# Patient Record
Sex: Female | Born: 1980 | Race: Black or African American | Hispanic: No | Marital: Single | Smoking: Never smoker
Health system: Southern US, Community
[De-identification: ages and names within clinical notes are randomized; demographics above are authoritative.]

## PROBLEM LIST (undated history)

## (undated) DIAGNOSIS — K219 Gastro-esophageal reflux disease without esophagitis: Secondary | ICD-10-CM

## (undated) DIAGNOSIS — Z8619 Personal history of other infectious and parasitic diseases: Secondary | ICD-10-CM

## (undated) DIAGNOSIS — I10 Essential (primary) hypertension: Secondary | ICD-10-CM

## (undated) DIAGNOSIS — F419 Anxiety disorder, unspecified: Secondary | ICD-10-CM

## (undated) HISTORY — PX: TONSILLECTOMY: SUR1361

## (undated) HISTORY — PX: NO PAST SURGERIES: SHX2092

## (undated) HISTORY — DX: Personal history of other infectious and parasitic diseases: Z86.19

## (undated) HISTORY — DX: Essential (primary) hypertension: I10

---
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
## 2002-04-07 ENCOUNTER — Emergency Department (HOSPITAL_COMMUNITY): Admission: EM | Admit: 2002-04-07 | Discharge: 2002-04-07 | Payer: Self-pay | Admitting: Emergency Medicine

## 2002-04-07 ENCOUNTER — Encounter: Payer: Self-pay | Admitting: Emergency Medicine

## 2005-04-18 ENCOUNTER — Emergency Department (HOSPITAL_COMMUNITY): Admission: EM | Admit: 2005-04-18 | Discharge: 2005-04-18 | Payer: Self-pay | Admitting: Emergency Medicine

## 2009-05-07 ENCOUNTER — Emergency Department (HOSPITAL_COMMUNITY): Admission: EM | Admit: 2009-05-07 | Discharge: 2009-05-07 | Payer: Self-pay | Admitting: Emergency Medicine

## 2009-05-10 ENCOUNTER — Emergency Department (HOSPITAL_COMMUNITY): Admission: EM | Admit: 2009-05-10 | Discharge: 2009-05-10 | Payer: Self-pay | Admitting: Emergency Medicine

## 2010-04-11 ENCOUNTER — Ambulatory Visit
Admission: RE | Admit: 2010-04-11 | Discharge: 2010-04-11 | Payer: Self-pay | Source: Home / Self Care | Attending: Family Medicine | Admitting: Family Medicine

## 2010-04-11 ENCOUNTER — Other Ambulatory Visit: Payer: Self-pay | Admitting: Family Medicine

## 2010-04-11 ENCOUNTER — Encounter: Payer: Self-pay | Admitting: Family Medicine

## 2010-04-11 DIAGNOSIS — F3289 Other specified depressive episodes: Secondary | ICD-10-CM | POA: Insufficient documentation

## 2010-04-11 DIAGNOSIS — K219 Gastro-esophageal reflux disease without esophagitis: Secondary | ICD-10-CM | POA: Insufficient documentation

## 2010-04-11 DIAGNOSIS — F329 Major depressive disorder, single episode, unspecified: Secondary | ICD-10-CM | POA: Insufficient documentation

## 2010-04-11 LAB — CBC WITH DIFFERENTIAL/PLATELET
Basophils Absolute: 0 10*3/uL (ref 0.0–0.1)
Basophils Relative: 0.4 % (ref 0.0–3.0)
Eosinophils Absolute: 0 10*3/uL (ref 0.0–0.7)
Eosinophils Relative: 0.3 % (ref 0.0–5.0)
HCT: 41 % (ref 36.0–46.0)
Hemoglobin: 13.8 g/dL (ref 12.0–15.0)
Lymphocytes Relative: 40.2 % (ref 12.0–46.0)
Lymphs Abs: 2.6 10*3/uL (ref 0.7–4.0)
MCHC: 33.6 g/dL (ref 30.0–36.0)
MCV: 91.2 fl (ref 78.0–100.0)
Monocytes Absolute: 0.5 10*3/uL (ref 0.1–1.0)
Monocytes Relative: 7.1 % (ref 3.0–12.0)
Neutro Abs: 3.3 10*3/uL (ref 1.4–7.7)
Neutrophils Relative %: 52 % (ref 43.0–77.0)
Platelets: 320 10*3/uL (ref 150.0–400.0)
RBC: 4.5 Mil/uL (ref 3.87–5.11)
RDW: 14.7 % — ABNORMAL HIGH (ref 11.5–14.6)
WBC: 6.4 10*3/uL (ref 4.5–10.5)

## 2010-04-11 LAB — BASIC METABOLIC PANEL
BUN: 10 mg/dL (ref 6–23)
CO2: 26 mEq/L (ref 19–32)
Calcium: 9.2 mg/dL (ref 8.4–10.5)
Chloride: 107 mEq/L (ref 96–112)
Creatinine, Ser: 0.9 mg/dL (ref 0.4–1.2)
GFR: 100.49 mL/min (ref >60.00)
Glucose, Bld: 82 mg/dL (ref 70–99)
Potassium: 4.2 mEq/L (ref 3.5–5.1)
Sodium: 142 mEq/L (ref 135–145)

## 2010-04-11 LAB — LDL CHOLESTEROL, DIRECT: Direct LDL: 101.2 mg/dL

## 2010-04-11 LAB — LIPID PANEL
Cholesterol: 217 mg/dL — ABNORMAL HIGH (ref 0–200)
HDL: 98.3 mg/dL (ref >39.00)
Total CHOL/HDL Ratio: 2
Triglycerides: 49 mg/dL (ref 0.0–149.0)
VLDL: 9.8 mg/dL (ref 0.0–40.0)

## 2010-04-11 LAB — HEPATIC FUNCTION PANEL
ALT: 10 U/L (ref 0–35)
AST: 16 U/L (ref 0–37)
Albumin: 3.9 g/dL (ref 3.5–5.2)
Alkaline Phosphatase: 64 U/L (ref 39–117)
Bilirubin, Direct: 0.1 mg/dL (ref 0.0–0.3)
Total Bilirubin: 0.6 mg/dL (ref 0.3–1.2)
Total Protein: 7.2 g/dL (ref 6.0–8.3)

## 2010-04-11 LAB — TSH: TSH: 0.54 u[IU]/mL (ref 0.35–5.50)

## 2010-04-11 LAB — H. PYLORI ANTIBODY, IGG: H Pylori IgG: NEGATIVE

## 2010-04-15 ENCOUNTER — Telehealth (INDEPENDENT_AMBULATORY_CARE_PROVIDER_SITE_OTHER): Payer: Self-pay | Admitting: *Deleted

## 2010-05-01 NOTE — Assessment & Plan Note (Signed)
Summary: cpx and fasting labs///sph   Vital Signs:  Patient profile:   30 year old female Height:      53.75 inches Weight:      186 pounds BMI:     45.43 Pulse rate:   80 / minute BP sitting:   110 / 60  (left arm)  Vitals Entered By: Doristine Devoid CMA (April 11, 2010 8:16 AM) CC: NEW EST- acid reflux and increase stress    History of Present Illness: 30 yo woman here today to establish care.  previous MD- none.  GYN- [facility].  1) GERD- has used Prilosec w/out relief and Nexium w/ some relief.  will still have burning sensation in stomach.  has increased abdominal cramping.  if she eats prior to lying down will wake up w/ food in throat.  any acidic food increases sxs.  not currently on meds.  grandmother has hiatal hernia.  2) increased stress- mom has breast cancer, 56 month old nephew just died, recently ended relationship, work stress.  crying frequently.  no longer enjoying things she used to enjoy.  withdrawing from people.  having difficulty both falling and staying asleep- using sleep aids daily.  eating less.  increased irritability and mood swings.  Preventive Screening-Counseling & Management  Alcohol-Tobacco     Alcohol drinks/day: <1     Smoking Status: never  Caffeine-Diet-Exercise     Does Patient Exercise: no      Drug Use:  never.    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 No apparent alopecia or balding. Neck:  No deformities, masses, or tenderness noted. Lungs:  Normal respiratory effort, chest expands symmetrically. Lungs are clear to auscultation, no crackles or wheezes. Heart:  Normal rate and regular rhythm. S1 and S2 normal without gallop, murmur, click, rub or other extra sounds. Abdomen:  Bowel sounds positive,abdomen soft and non-tender without masses, organomegaly or hernias noted. Psych:  holding back tears, good eye contact, not anxious appearing, but depressed   Impression & Recommendations:  Problem # 1:  GERD (ICD-530.81) Assessment New check H pylori to r/o PUD.  samples of nexium given.  reviewed lifestyle modifications.  will follow. Orders: Venipuncture (11914) Specimen Handling (78295) TLB-H. Pylori Abs(Helicobacter Pylori) (86677-HELICO)  Problem # 2:  DEPRESSIVE DISORDER (ICD-311) Assessment: New pt has a lot of stressors currently.  discussed option of starting meds, pt not interested at this time.  is interested in counseling.  #s provided.  Complete Medication List: 1)  Vitamin D (ergocalciferol) 50000 Unit Caps (Ergocalciferol) .... Take one tablet weekly x12 weeks  Other Orders: T-Vitamin D (25-Hydroxy) ([id number]) TLB-Lipid Panel (80061-LIPID) TLB-BMP (Basic Metabolic Panel-BMET) (80048-METABOL) TLB-CBC Platelet - w/Differential (85025-CBCD) TLB-Hepatic/Liver Function Pnl (80076-HEPATIC) TLB-TSH (Thyroid Stimulating Hormone) (46962-XBM)  Patient Instructions: 1)  Schedule your complete physical in 1 month- you can eat 2)  Start the Nexium daily 3)  Call and set up counseling to deal with some of these emotions 4)  We'll notify you of your lab results 5)  Welcome!  We're glad to have you! 6)  Hang in there!   Orders Added: 1)  Venipuncture [36415] 2)  T-Vitamin D (25-Hydroxy) [phone] 3)  Specimen Handling [99000] 4)  Specimen Handling [99000] 5)  TLB-H. Pylori  Abs(Helicobacter Pylori) [86677-HELICO] 6)  TLB-Lipid Panel [80061-LIPID] 7)  TLB-BMP (Basic Metabolic Panel-BMET) [80048-METABOL] 8)  TLB-CBC Platelet - w/Differential [85025-CBCD] 9)  TLB-Hepatic/Liver Function Pnl [80076-HEPATIC] 10)  TLB-TSH (Thyroid Stimulating Hormone) [84443-TSH] 11)  New Patient Level II [99202]     Preventive Care Screening  Pap Smear:    Date:  05/28/2009    Results:  normal

## 2010-05-01 NOTE — Progress Notes (Signed)
Summary: labs  Phone Note Outgoing Call   Call placed by: Doristine Devoid CMA,  April 15, 2010 10:39 AM Call placed to: Patient Summary of Call: very low.  start 50,000 units Vit D weekly x12 weeks and recheck level.  Follow-up for Phone Call        spoke w/ patient aware of labs and that medication is to be sent to pharmacy...Marland KitchenMarland KitchenDoristine Devoid CMA  April 15, 2010 10:40 AM     New/Updated Medications: VITAMIN D (ERGOCALCIFEROL) 50000 UNIT CAPS (ERGOCALCIFEROL) take one tablet weekly x12 weeks Prescriptions: VITAMIN D (ERGOCALCIFEROL) 50000 UNIT CAPS (ERGOCALCIFEROL) take one tablet weekly x12 weeks  #12 x 0   Entered by:   Doristine Devoid CMA   Authorized by:   Neena Rhymes MD   Signed by:   Doristine Devoid CMA on 04/15/2010   Method used:   Electronically to        [facility]. [phone]* (retail)       [address]       [place], [place]  [address]       Ph: [phone]       Fax: [phone]   RxID:   [id number]

## 2010-05-06 ENCOUNTER — Encounter: Payer: Self-pay | Admitting: Family Medicine

## 2010-05-16 ENCOUNTER — Encounter: Payer: Self-pay | Admitting: Family Medicine

## 2010-05-26 ENCOUNTER — Encounter: Payer: Self-pay | Admitting: Family Medicine

## 2010-05-26 ENCOUNTER — Encounter (INDEPENDENT_AMBULATORY_CARE_PROVIDER_SITE_OTHER): Payer: Managed Care, Other (non HMO) | Admitting: Family Medicine

## 2010-05-26 DIAGNOSIS — Z0000 Encounter for general adult medical examination without abnormal findings: Secondary | ICD-10-CM

## 2010-06-05 NOTE — Assessment & Plan Note (Signed)
Summary: CPX/NO LABS///SPH   Vital Signs:  Patient profile:   30 year old female Height:      53.75 inches Weight:      189 pounds BMI:     46.16 Pulse rate:   76 / minute BP sitting:   120 / 80  (left arm)  Vitals Entered By: Doristine Devoid CMA (May 26, 2010 10:06 AM) CC: CPX AND LABS    History of Present Illness: 30 yo woman here today for CPE.  recents labs reviewed.  UTD on pap- Dr Achilles Dunk.  Depression- has started Journal as emotional outlet.  mom is doing well.  overall feeling 'better'.  'slowly but surely' integrating herself back into normal activities.  Preventive Screening-Counseling & Management  Alcohol-Tobacco     Alcohol drinks/day: <1     Smoking Status: never  Caffeine-Diet-Exercise     Does Patient Exercise: no      Drug Use:  never.    Current Medications (verified): 1)  Vitamin D (Ergocalciferol) 50000 Unit Caps (Ergocalciferol) .... Take One Tablet Weekly X12 Weeks  Allergies (verified): No Known Drug Allergies  Past History:  Past medical, surgical, family and social histories (including risk factors) reviewed, and no changes noted (except as noted below).  Past Medical History: Reviewed history from 04/11/2010 and no changes required. hx of Chicken Pox  Past Surgical History: none  Family History: Reviewed history from 04/11/2010 and no changes required. CAD-no HTN-no DM-grandparents STROKE-grandparents COLON CA-no BREAST CA-mother dx: 63  Social History: Reviewed history from 04/11/2010 and no changes required. 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  The patient denies fever, weight loss, weight gain, vision loss, decreased hearing, hoarseness, chest pain, syncope, dyspnea on exertion, peripheral edema, prolonged cough, headaches, abdominal pain, melena, hematochezia, severe indigestion/heartburn,  hematuria, suspicious skin lesions, abnormal bleeding, enlarged lymph nodes, and breast masses.    Physical Exam  General:  Well-developed,well-nourished,in no acute distress; alert,appropriate and cooperative throughout examination Head:  Normocephalic and atraumatic without obvious abnormalities. No apparent alopecia or balding. Eyes:  No corneal or conjunctival inflammation noted. EOMI. Perrla. Funduscopic exam benign, without hemorrhages, exudates or papilledema. Vision grossly normal. Ears:  External ear exam shows no significant lesions or deformities.  Otoscopic examination reveals clear canals, tympanic membranes are intact bilaterally without bulging, retraction, inflammation or discharge. Hearing is grossly normal bilaterally. Nose:  bilateral turbinate edema Mouth:  Oral mucosa and oropharynx without lesions or exudates.  Teeth in good repair. Neck:  No deformities, masses, or tenderness noted. Breasts:  gyn Lungs:  Normal respiratory effort, chest expands symmetrically. Lungs are clear to auscultation, no crackles or wheezes. Heart:  Normal rate and regular rhythm. S1 and S2 normal without gallop, murmur, click, rub or other extra sounds. Abdomen:  Bowel sounds positive,abdomen soft and non-tender without masses, organomegaly or hernias noted. Genitalia:  gyn Pulses:  + 2 carotid, radial, DP Extremities:  No clubbing, cyanosis, edema, or deformity noted with normal full range of motion of all joints.   Neurologic:  No cranial nerve deficits noted. Station and gait are normal. Plantar reflexes are down-going bilaterally. DTRs are symmetrical throughout. Sensory, motor and coordinative functions appear intact. Skin:  Intact without suspicious lesions or rashes, multiple tattoos Cervical Nodes:  No lymphadenopathy noted Axillary Nodes:  No palpable lymphadenopathy Psych:  Cognition and judgment appear intact. Alert and cooperative with  normal attention span and concentration. No  apparent delusions, illusions, hallucinations   Impression & Recommendations:  Problem # 1:  PHYSICAL EXAMINATION (ICD-V70.0) Assessment Unchanged pt's PE WNL.  reviewed labs from previous visit.  anticipatory guidance provided.  Complete Medication List: 1)  Vitamin D (ergocalciferol) 50000 Unit Caps (Ergocalciferol) .... Take one tablet weekly x12 weeks  Patient Instructions: 1)  Your exam looks great!  Keep up the good work! 2)  Call with any questions or concerns 3)  I'm so glad that you're starting to feel better 4)  Start Claritin or Zyrtec daily for your seasonal allergies 5)  Happy Spring! 6)  Hang in there!!!   Orders Added: 1)  Est. Patient 18-39 years [99395]

## 2010-06-19 LAB — DIFFERENTIAL
Basophils Relative: 1 % (ref 0–1)
Monocytes Relative: 8 % (ref 3–12)
Neutro Abs: 1.6 10*3/uL — ABNORMAL LOW (ref 1.7–7.7)
Neutrophils Relative %: 37 % — ABNORMAL LOW (ref 43–77)

## 2010-06-19 LAB — URINALYSIS, ROUTINE W REFLEX MICROSCOPIC
Glucose, UA: NEGATIVE mg/dL
Nitrite: NEGATIVE
Protein, ur: 30 mg/dL — AB
Urobilinogen, UA: 1 mg/dL (ref 0.0–1.0)

## 2010-06-19 LAB — CBC
HCT: 48.8 % — ABNORMAL HIGH (ref 36.0–46.0)
Hemoglobin: 16.8 g/dL — ABNORMAL HIGH (ref 12.0–15.0)
MCHC: 34.3 g/dL (ref 30.0–36.0)
RDW: 13.8 % (ref 11.5–15.5)

## 2010-06-19 LAB — URINE MICROSCOPIC-ADD ON

## 2010-06-19 LAB — COMPREHENSIVE METABOLIC PANEL
Alkaline Phosphatase: 66 U/L (ref 39–117)
BUN: 9 mg/dL (ref 6–23)
Calcium: 9.6 mg/dL (ref 8.4–10.5)
Glucose, Bld: 76 mg/dL (ref 70–99)
Potassium: 3.4 mEq/L — ABNORMAL LOW (ref 3.5–5.1)
Total Protein: 8.5 g/dL — ABNORMAL HIGH (ref 6.0–8.3)

## 2010-06-19 LAB — LIPASE, BLOOD: Lipase: 34 U/L (ref 11–59)

## 2010-09-12 ENCOUNTER — Encounter: Payer: Self-pay | Admitting: Family Medicine

## 2010-09-15 ENCOUNTER — Encounter: Payer: Self-pay | Admitting: Family Medicine

## 2010-09-15 ENCOUNTER — Ambulatory Visit (INDEPENDENT_AMBULATORY_CARE_PROVIDER_SITE_OTHER): Payer: Managed Care, Other (non HMO) | Admitting: Family Medicine

## 2010-09-15 ENCOUNTER — Ambulatory Visit (HOSPITAL_BASED_OUTPATIENT_CLINIC_OR_DEPARTMENT_OTHER)
Admission: RE | Admit: 2010-09-15 | Discharge: 2010-09-15 | Disposition: A | Discharge status: Home / Self Care | Payer: Managed Care, Other (non HMO) | Source: Ambulatory Visit | Attending: Family Medicine | Admitting: Family Medicine

## 2010-09-15 VITALS — BP 140/88 | Temp 99.1°F | Wt 188.6 lb

## 2010-09-15 DIAGNOSIS — R141 Gas pain: Secondary | ICD-10-CM | POA: Insufficient documentation

## 2010-09-15 DIAGNOSIS — R1013 Epigastric pain: Secondary | ICD-10-CM

## 2010-09-15 DIAGNOSIS — R143 Flatulence: Secondary | ICD-10-CM | POA: Insufficient documentation

## 2010-09-15 DIAGNOSIS — R112 Nausea with vomiting, unspecified: Secondary | ICD-10-CM

## 2010-09-15 DIAGNOSIS — R142 Eructation: Secondary | ICD-10-CM | POA: Insufficient documentation

## 2010-09-15 LAB — CBC WITH DIFFERENTIAL/PLATELET
Basophils Absolute: 0 10*3/uL (ref 0.0–0.1)
Eosinophils Absolute: 0 10*3/uL (ref 0.0–0.7)
HCT: 39.3 % (ref 36.0–46.0)
Hemoglobin: 13.1 g/dL (ref 12.0–15.0)
Lymphs Abs: 2.5 10*3/uL (ref 0.7–4.0)
MCHC: 33.3 g/dL (ref 30.0–36.0)
MCV: 91.8 fl (ref 78.0–100.0)
Neutro Abs: 2.7 10*3/uL (ref 1.4–7.7)
RDW: 14.8 % — ABNORMAL HIGH (ref 11.5–14.6)

## 2010-09-15 LAB — BASIC METABOLIC PANEL
CO2: 23 mEq/L (ref 19–32)
Calcium: 8.9 mg/dL (ref 8.4–10.5)
Chloride: 102 mEq/L (ref 96–112)
Sodium: 139 mEq/L (ref 135–145)

## 2010-09-15 LAB — LIPASE: Lipase: 55 U/L (ref 11.0–59.0)

## 2010-09-15 LAB — HEPATIC FUNCTION PANEL
Albumin: 4 g/dL (ref 3.5–5.2)
Total Protein: 7.4 g/dL (ref 6.0–8.3)

## 2010-09-15 LAB — AMYLASE: Amylase: 119 U/L (ref 27–131)

## 2010-09-15 NOTE — Patient Instructions (Signed)
Continue the nexium We'll notify you of your lab results and ultrasound Try and limit the amount of fatty food in your diet Drink plenty of fluids Call with any questions or concerns Hang in there!!!

## 2010-09-15 NOTE — Progress Notes (Signed)
  Subjective:    Patient ID: Emily Pham, female    DOB: [birth year], 30 y.o.   MRN: [id number]  HPI Nausea/bloating- sxs x2 months but most noticeable x2 weeks.  Seems to only occur on Thursdays- will eat lunch and then feel the need to vomit.  'i feel horrible afterwards'.  Will feel really bloated after eating, + gas pain, early satiety.  Denies fevers.  Pain is epigastric.  Taking Nexium for GERD.  Denies shoulder pain.  + diarrhea- will vary between soft and watery stools.  Denies family hx of gallbladder problems.  sxs are worse w/ fatty foods.   Review of Systems For ROS see HPI     Objective:   Physical Exam  Constitutional: She appears well-developed and well-nourished. No distress.  HENT:  Head: Normocephalic and atraumatic.  Neck: Normal range of motion. Neck supple. No thyromegaly present.  Cardiovascular: Normal rate, regular rhythm, normal heart sounds and intact distal pulses.   Pulmonary/Chest: Effort normal and breath sounds normal. No respiratory distress. She has no wheezes. She has no rales.  Abdominal: Soft. Bowel sounds are normal. She exhibits no distension. There is no tenderness. There is no rebound and no guarding.  Lymphadenopathy:    She has no cervical adenopathy.          Assessment & Plan:

## 2010-09-15 NOTE — Assessment & Plan Note (Signed)
Pt w/out pain today but based on epigastric nature and worsening w/ fatty foods will attempt to r/o gallbladder involvement w/ [place] and labs.  Also check pancreas.  Add CA-125 due to early satiety.  Continue Nexium.  Reviewed supportive care and red flags that should prompt return.  Pt expressed understanding and is in agreement w/ plan.

## 2010-09-16 ENCOUNTER — Encounter: Payer: Self-pay | Admitting: *Deleted

## 2010-09-16 LAB — H. PYLORI ANTIBODY, IGG: H Pylori IgG: NEGATIVE

## 2011-02-26 ENCOUNTER — Other Ambulatory Visit: Payer: Self-pay | Admitting: *Deleted

## 2011-02-26 ENCOUNTER — Encounter: Payer: Self-pay | Admitting: Family Medicine

## 2011-02-26 ENCOUNTER — Ambulatory Visit (INDEPENDENT_AMBULATORY_CARE_PROVIDER_SITE_OTHER): Payer: Managed Care, Other (non HMO) | Admitting: Family Medicine

## 2011-02-26 VITALS — BP 120/75 | HR 70 | Temp 97.7°F | Ht 63.5 in | Wt 205.6 lb

## 2011-02-26 DIAGNOSIS — N644 Mastodynia: Secondary | ICD-10-CM | POA: Insufficient documentation

## 2011-02-26 MED ORDER — NAPROXEN 500 MG PO TABS: 500.0000 mg | ORAL_TABLET | Freq: Two times a day (BID) | ORAL | Status: AC

## 2011-02-26 NOTE — Patient Instructions (Signed)
This is inflammation of the breast ligament Take Naproxen twice daily x5 days (w/ food) and then as needed for pain Wear a sports bra to provide good support over the next few days while it heals Heating pad will help Call with any questions or concerns Happy Holidays!

## 2011-02-26 NOTE — Progress Notes (Signed)
  Subjective:    Patient ID: Emily Pham, female    DOB: 03-25-1981, 30 y.o.   MRN: [id number]  HPI Chest pain- L sided along top of breast.  + TTP, pain w/ movement.  Pain is intermittent.  No SOB, N/V.  Has not taken OTC pain relievers.  L breast is smaller.   Review of Systems For ROS see HPI     Objective:   Physical Exam  Vitals reviewed. Constitutional: She appears well-developed and well-nourished. No distress.  Cardiovascular: Normal rate, regular rhythm and normal heart sounds.   No murmur heard. Pulmonary/Chest: Effort normal and breath sounds normal. No respiratory distress. She has no wheezes. She has no rales. She exhibits tenderness (over L upper medial breast in area of suspensory ligament attachment).          Assessment & Plan:

## 2011-02-26 NOTE — Assessment & Plan Note (Signed)
Due to strain on L breast suspensory ligament.  Start NSAIDs.  Encouraged sports bra for better support.  Pt to call if sxs don't improve.  Reviewed supportive care and red flags that should prompt return.  Pt expressed understanding and is in agreement w/ plan.

## 2011-02-26 NOTE — Telephone Encounter (Signed)
Noted current pharmacy not listed in our system per new. Called [facility] [address] to verbally cancel order for Nap. And verbally called new [facility] on [address] to give new order naprosyn 500mg  #60 with 2 refills takes 2 times daily with meals. Pharmacy tech understood new rx. Pt aware

## 2011-04-17 ENCOUNTER — Encounter (INDEPENDENT_AMBULATORY_CARE_PROVIDER_SITE_OTHER): Payer: Managed Care, Other (non HMO) | Admitting: Ophthalmology

## 2011-04-17 DIAGNOSIS — H35419 Lattice degeneration of retina, unspecified eye: Secondary | ICD-10-CM

## 2011-04-17 DIAGNOSIS — H33309 Unspecified retinal break, unspecified eye: Secondary | ICD-10-CM

## 2011-04-17 DIAGNOSIS — H3540 Unspecified peripheral retinal degeneration: Secondary | ICD-10-CM

## 2011-04-17 DIAGNOSIS — H43819 Vitreous degeneration, unspecified eye: Secondary | ICD-10-CM

## 2011-05-01 ENCOUNTER — Encounter (INDEPENDENT_AMBULATORY_CARE_PROVIDER_SITE_OTHER): Payer: Managed Care, Other (non HMO) | Admitting: Ophthalmology

## 2011-05-01 DIAGNOSIS — H33309 Unspecified retinal break, unspecified eye: Secondary | ICD-10-CM

## 2011-05-04 ENCOUNTER — Ambulatory Visit (INDEPENDENT_AMBULATORY_CARE_PROVIDER_SITE_OTHER): Payer: Managed Care, Other (non HMO) | Admitting: Ophthalmology

## 2011-07-20 ENCOUNTER — Ambulatory Visit (INDEPENDENT_AMBULATORY_CARE_PROVIDER_SITE_OTHER): Payer: Managed Care, Other (non HMO) | Admitting: Ophthalmology

## 2011-07-20 DIAGNOSIS — H33309 Unspecified retinal break, unspecified eye: Secondary | ICD-10-CM

## 2011-07-20 DIAGNOSIS — H43819 Vitreous degeneration, unspecified eye: Secondary | ICD-10-CM

## 2011-07-20 DIAGNOSIS — H3540 Unspecified peripheral retinal degeneration: Secondary | ICD-10-CM

## 2012-02-11 ENCOUNTER — Ambulatory Visit (INDEPENDENT_AMBULATORY_CARE_PROVIDER_SITE_OTHER): Payer: Managed Care, Other (non HMO) | Admitting: Family Medicine

## 2012-02-11 ENCOUNTER — Encounter: Payer: Self-pay | Admitting: Family Medicine

## 2012-02-11 VITALS — BP 150/92 | HR 84 | Temp 98.2°F | Wt 212.0 lb

## 2012-02-11 DIAGNOSIS — F329 Major depressive disorder, single episode, unspecified: Secondary | ICD-10-CM

## 2012-02-11 DIAGNOSIS — I10 Essential (primary) hypertension: Secondary | ICD-10-CM | POA: Insufficient documentation

## 2012-02-11 LAB — BASIC METABOLIC PANEL
BUN: 10 mg/dL (ref 6–23)
Calcium: 9.1 mg/dL (ref 8.4–10.5)
GFR: 107.87 mL/min (ref >60.00)
Potassium: 3.6 mEq/L (ref 3.5–5.1)
Sodium: 138 mEq/L (ref 135–145)

## 2012-02-11 MED ORDER — HYDROCHLOROTHIAZIDE 12.5 MG PO TABS: 12.5000 mg | ORAL_TABLET | Freq: Every day | ORAL | Status: DC

## 2012-02-11 NOTE — Progress Notes (Signed)
  Subjective:    Patient ID: Emily Pham, female    DOB: 11/26/1980, 31 y.o.   MRN: [id number]  HPI Elevated BP- pt reports a few months ago BP was 130/80, yesterday at health screen at work was 140/90 and cholesterol was 228.  Today pt is very concerned b/c BP is even higher.  Admits increased stress w/ work, relationship, and school.  + family hx of HTN.  Denies SOB.  Having 'anxious feeling'- 'very prominent lately'.  + HAs- 'pounding in my ear', improved w/ excedrin.  Some throbbing CP at rest that improves spontaneously.  No N/V.  Pt reports feeling sad, tearful in office.  Having trouble sleeping at night- is smoking pot to improve her ability to fall asleep.   Review of Systems For ROS see HPI     Objective:   Physical Exam  Vitals reviewed. Constitutional: She is oriented to person, place, and time. She appears well-developed and well-nourished. No distress.  HENT:  Head: Normocephalic and atraumatic.  Eyes: Conjunctivae normal and EOM are normal. Pupils are equal, round, and reactive to light.  Neck: Normal range of motion. Neck supple. No thyromegaly present.  Cardiovascular: Normal rate, regular rhythm, normal heart sounds and intact distal pulses.   No murmur heard. Pulmonary/Chest: Effort normal and breath sounds normal. No respiratory distress.  Abdominal: Soft. She exhibits no distension. There is no tenderness.  Musculoskeletal: She exhibits no edema.  Lymphadenopathy:    She has no cervical adenopathy.  Neurological: She is alert and oriented to person, place, and time.  Skin: Skin is warm and dry.  Psychiatric: Her behavior is normal.       Anxious, tearful          Assessment & Plan:

## 2012-02-11 NOTE — Patient Instructions (Addendum)
Follow up in 2-3 weeks to recheck blood pressure We'll notify you of your lab results Make sure you are exercising regularly as a stress outlet Low salt diet Start the HCTZ daily for BP Consider counseling for all the stress you're under Call with any questions or concerns Hang in there!!

## 2012-02-14 NOTE — Assessment & Plan Note (Signed)
Deteriorated.  Strongly encouraged pt to stop smoking pot as this may be making her paranoid or more anxious.  Discussed counseling- names and #s given.  Will consider starting meds at future visits but will get BP controlled first.  Pt expressed understanding and is in agreement w/ plan.

## 2012-02-14 NOTE — Assessment & Plan Note (Signed)
New.  Multiple elevated readings qualify her as hypertensive.  Start HCTZ.  Check baseline BMP today and repeat in 3-4 weeks at BP recheck.  Reviewed supportive care and red flags that should prompt return.  Pt expressed understanding and is in agreement w/ plan.

## 2012-02-15 ENCOUNTER — Telehealth: Payer: Self-pay | Admitting: Family Medicine

## 2012-02-15 NOTE — Telephone Encounter (Signed)
[facility] Triage Call Report Triage Record Num: [id number] Operator: Amy Head Patient Name: Emily Pham Call Date & Time: 02/11/2012 5:21:19PM Patient Phone: [phone] PCP: Patient Gender: Female PCP Fax : Patient DOB: 1980-10-28 Practice Name: [facility] Reason for Call: Caller: Kalyse/Patient; PCP: Sheliah Hatch.; CB#: [phone]; Call regarding States that she just missed a call from office approximately 5 minutes ago. States that she had lab test done today.; Verified in EPIC that Dr. Beverely Low documented patient's labs are normal and to start HCTZ as planned. Patient agreed. Protocol(s) Used: Information Only Call; No Symptom Triage (Adult) Recommended Outcome per Protocol: Provide Information or Advice Only Reason for Outcome: Requesting information regarding scheduled exam, test or procedure; no triage required. Information provided from approved resources or clinical experience. Care Advice: ~

## 2012-02-15 NOTE — Telephone Encounter (Signed)
Noted  

## 2012-02-15 NOTE — Telephone Encounter (Signed)
Just making sure you seen this. It was routed to me.

## 2012-02-16 ENCOUNTER — Telehealth: Payer: Self-pay | Admitting: *Deleted

## 2012-02-16 NOTE — Telephone Encounter (Signed)
Spoke with patient at home and informed her of Dr. Rennis Golden recommendations to decrease HCTZ to 12.5mg  as prescribed, increase water intake and potassium rich foods.Advised to be patient as this medication can take a little time to see optimal results. Instructed to call back with further questions or concerns. SGJ, RN

## 2012-02-16 NOTE — Telephone Encounter (Signed)
Patient called today and wants [place] to know that she increased her dose of HCTZ to 25mg  rather than the 12.5mg  prescribed by Dr. Beverely Low because "her grandmother is a nurse and told her to do that." She is now feeling dizzy and having some nausea but her B/P is 130/80. I instructed her that she should not change her medication doses without first discussing it with her physician. I also told her that I would send a note to Dr. Beverely Low making her aware of the situation and see what she thinks. Vidal Schwalbe, RN

## 2012-02-16 NOTE — Telephone Encounter (Signed)
Should decrease her dose back to 12.5mg  as prescribed.  She has only been on this medicine for 4-5 days and it will take some time to work.  130/80 is a good number and it may continue to decrease w/ time.  Increase water intake, add some K+ in the form of bananas, orange juice, leafy greens.

## 2012-03-02 ENCOUNTER — Ambulatory Visit (INDEPENDENT_AMBULATORY_CARE_PROVIDER_SITE_OTHER): Payer: Managed Care, Other (non HMO) | Admitting: Family Medicine

## 2012-03-02 ENCOUNTER — Encounter: Payer: Self-pay | Admitting: Family Medicine

## 2012-03-02 VITALS — BP 138/78 | HR 73 | Temp 99.2°F | Ht 63.25 in | Wt 203.6 lb

## 2012-03-02 DIAGNOSIS — I10 Essential (primary) hypertension: Secondary | ICD-10-CM

## 2012-03-02 LAB — BASIC METABOLIC PANEL
CO2: 27 mEq/L (ref 19–32)
Calcium: 9.2 mg/dL (ref 8.4–10.5)
Creatinine, Ser: 0.8 mg/dL (ref 0.4–1.2)
Sodium: 138 mEq/L (ref 135–145)

## 2012-03-02 NOTE — Assessment & Plan Note (Signed)
Improved since starting HCTZ.  Check BMP to ensure normal K+ and Cr.  No med changes at this time.  Will continue to follow.

## 2012-03-02 NOTE — Progress Notes (Signed)
  Subjective:    Patient ID: Emily Pham, female    DOB: December 19, 1980, 31 y.o.   MRN: [id number]  HPI HTN- newly dx'd at last visit.  Started on HCTZ 12.5mg .  BP has improved.  Initially felt bad on meds but as body adjusted, now feels better.  No longer having HAs or pounding in the ears, no CP, SOB.  Pt also feels mood has improved.     Review of Systems For ROS see HPI     Objective:   Physical Exam  Vitals reviewed. Constitutional: She is oriented to person, place, and time. She appears well-developed and well-nourished. No distress.  HENT:  Head: Normocephalic and atraumatic.  Eyes: Conjunctivae normal and EOM are normal. Pupils are equal, round, and reactive to light.  Neck: Normal range of motion. Neck supple. No thyromegaly present.  Cardiovascular: Normal rate, regular rhythm, normal heart sounds and intact distal pulses.   No murmur heard. Pulmonary/Chest: Effort normal and breath sounds normal. No respiratory distress.  Abdominal: Soft. She exhibits no distension. There is no tenderness.  Musculoskeletal: She exhibits no edema.  Lymphadenopathy:    She has no cervical adenopathy.  Neurological: She is alert and oriented to person, place, and time.  Skin: Skin is warm and dry.  Psychiatric: She has a normal mood and affect. Her behavior is normal.          Assessment & Plan:

## 2012-03-02 NOTE — Patient Instructions (Addendum)
Schedule your complete physical in 3-4 months You look great!  Keep up the good work! We'll notify you of your lab results Happy Holidays!!!

## 2012-07-05 ENCOUNTER — Telehealth: Payer: Self-pay | Admitting: Family Medicine

## 2012-07-05 DIAGNOSIS — I10 Essential (primary) hypertension: Secondary | ICD-10-CM

## 2012-07-05 MED ORDER — HYDROCHLOROTHIAZIDE 12.5 MG PO TABS: 12.5000 mg | ORAL_TABLET | Freq: Every day | ORAL | Status: DC

## 2012-07-05 NOTE — Telephone Encounter (Signed)
Rx sent to the pharmacy by e-script.//AB/CMA 

## 2012-07-05 NOTE — Telephone Encounter (Signed)
Refill: hctz 12.5mg  tablet. Take 1 tablet by mouth daily. Qty 30. Last fill 04-08-12

## 2012-09-08 ENCOUNTER — Encounter: Payer: Self-pay | Admitting: Family Medicine

## 2012-09-08 ENCOUNTER — Ambulatory Visit (INDEPENDENT_AMBULATORY_CARE_PROVIDER_SITE_OTHER): Payer: Managed Care, Other (non HMO) | Admitting: Family Medicine

## 2012-09-08 VITALS — BP 120/90 | HR 81 | Temp 98.9°F | Ht 63.25 in | Wt 207.4 lb

## 2012-09-08 DIAGNOSIS — Z0000 Encounter for general adult medical examination without abnormal findings: Secondary | ICD-10-CM | POA: Insufficient documentation

## 2012-09-08 DIAGNOSIS — N644 Mastodynia: Secondary | ICD-10-CM

## 2012-09-08 LAB — LDL CHOLESTEROL, DIRECT: Direct LDL: 89.6 mg/dL

## 2012-09-08 LAB — CBC WITH DIFFERENTIAL/PLATELET
Basophils Relative: 0.4 % (ref 0.0–3.0)
Eosinophils Absolute: 0 10*3/uL (ref 0.0–0.7)
Eosinophils Relative: 0.2 % (ref 0.0–5.0)
Hemoglobin: 14.3 g/dL (ref 12.0–15.0)
Lymphocytes Relative: 40 % (ref 12.0–46.0)
MCHC: 33.2 g/dL (ref 30.0–36.0)
Monocytes Relative: 5 % (ref 3.0–12.0)
Neutro Abs: 3.5 10*3/uL (ref 1.4–7.7)
Neutrophils Relative %: 54.4 % (ref 43.0–77.0)
RBC: 4.76 Mil/uL (ref 3.87–5.11)
WBC: 6.5 10*3/uL (ref 4.5–10.5)

## 2012-09-08 LAB — BASIC METABOLIC PANEL
BUN: 10 mg/dL (ref 6–23)
CO2: 25 mEq/L (ref 19–32)
Chloride: 103 mEq/L (ref 96–112)
Creatinine, Ser: 0.8 mg/dL (ref 0.4–1.2)
Glucose, Bld: 78 mg/dL (ref 70–99)
Potassium: 3.5 mEq/L (ref 3.5–5.1)

## 2012-09-08 LAB — HEPATIC FUNCTION PANEL
ALT: 13 U/L (ref 0–35)
AST: 16 U/L (ref 0–37)
Total Bilirubin: 0.5 mg/dL (ref 0.3–1.2)
Total Protein: 7.5 g/dL (ref 6.0–8.3)

## 2012-09-08 LAB — LIPID PANEL
Cholesterol: 217 mg/dL — ABNORMAL HIGH (ref 0–200)
VLDL: 13.6 mg/dL (ref 0.0–40.0)

## 2012-09-08 LAB — TSH: TSH: 0.63 u[IU]/mL (ref 0.35–5.50)

## 2012-09-08 NOTE — Assessment & Plan Note (Signed)
New.  No abnormality on PE.  Given hx of pain and mom's hx of breast cancer, will get diagnostic mammo.

## 2012-09-08 NOTE — Progress Notes (Signed)
  Subjective:    Patient ID: Emily Pham, female    DOB: 1980/08/22, 32 y.o.   MRN: [id number]  HPI CPE- UTD on GYN   Review of Systems Patient reports no vision/ hearing changes, adenopathy,fever, weight change,  persistant/recurrent hoarseness , swallowing issues, chest pain, palpitations, edema, persistant/recurrent cough, hemoptysis, dyspnea (rest/exertional/paroxysmal nocturnal), gastrointestinal bleeding (melena, rectal bleeding), abdominal pain, significant heartburn, bowel changes, GU symptoms (dysuria, hematuria, incontinence), Gyn symptoms (abnormal  bleeding, pain),  syncope, focal weakness, memory loss, numbness & tingling, skin/hair/nail changes, abnormal bruising or bleeding, anxiety, or depression.     Objective:   Physical Exam General Appearance:    Alert, cooperative, no distress, appears stated age  Head:    Normocephalic, without obvious abnormality, atraumatic  Eyes:    PERRL, conjunctiva/corneas clear, EOM's intact, fundi    benign, both eyes  Ears:    Normal TM's and external ear canals, both ears  Nose:   Nares normal, septum midline, mucosa normal, no drainage    or sinus tenderness  Throat:   Lips, mucosa, and tongue normal; teeth and gums normal  Neck:   Supple, symmetrical, trachea midline, no adenopathy;    Thyroid: no enlargement/tenderness/nodules  Back:     Symmetric, no curvature, ROM normal, no CVA tenderness  Lungs:     Clear to auscultation bilaterally, respirations unlabored  Chest Wall:    No tenderness or deformity   Heart:    Regular rate and rhythm, S1 and S2 normal, no murmur, rub   or gallop  Breast Exam:    L breast w/out mass or tenderness to palpation today  Abdomen:     Soft, non-tender, bowel sounds active all four quadrants,    no masses, no organomegaly  Genitalia:    Deferred to GYN  Rectal:    Extremities:   Extremities normal, atraumatic, no cyanosis or edema  Pulses:   2+ and symmetric all extremities  Skin:   Skin color,  texture, turgor normal, no rashes or lesions  Lymph nodes:   Cervical, supraclavicular, and axillary nodes normal  Neurologic:   CNII-XII intact, normal strength, sensation and reflexes    throughout          Assessment & Plan:

## 2012-09-08 NOTE — Patient Instructions (Addendum)
We'll call you with your mammogram appt We'll notify you of your lab results and make any changes if needed Keep up the good work!  You look great! Call with any questions or concerns Have a great summer!!!

## 2012-09-08 NOTE — Assessment & Plan Note (Signed)
Pt's PE WNL.  UTD on pap.  Check labs.  Anticipatory guidance provided.  

## 2012-09-09 ENCOUNTER — Encounter: Payer: Self-pay | Admitting: *Deleted

## 2012-09-14 LAB — VITAMIN D 1,25 DIHYDROXY
Vitamin D2 1, 25 (OH)2: <8 pg/mL
Vitamin D3 1, 25 (OH)2: 58 pg/mL

## 2012-09-16 ENCOUNTER — Encounter: Payer: Self-pay | Admitting: *Deleted

## 2013-01-08 ENCOUNTER — Other Ambulatory Visit: Payer: Self-pay | Admitting: Family Medicine

## 2013-01-09 NOTE — Telephone Encounter (Signed)
Med filled.  

## 2013-01-09 NOTE — Telephone Encounter (Signed)
Patient is calling back to check refill status.

## 2013-01-09 NOTE — Telephone Encounter (Signed)
Patient called to check on her refill for her blood pressure medicine because she has none left. Thanks

## 2013-03-02 ENCOUNTER — Ambulatory Visit: Payer: Managed Care, Other (non HMO) | Admitting: Family Medicine

## 2013-03-02 ENCOUNTER — Telehealth: Payer: Self-pay | Admitting: *Deleted

## 2013-03-08 ENCOUNTER — Ambulatory Visit (INDEPENDENT_AMBULATORY_CARE_PROVIDER_SITE_OTHER): Payer: Managed Care, Other (non HMO) | Admitting: Family Medicine

## 2013-03-08 ENCOUNTER — Encounter: Payer: Self-pay | Admitting: Family Medicine

## 2013-03-08 VITALS — BP 138/90 | HR 102 | Temp 98.3°F | Ht 63.25 in | Wt 216.0 lb

## 2013-03-08 DIAGNOSIS — J309 Allergic rhinitis, unspecified: Secondary | ICD-10-CM | POA: Insufficient documentation

## 2013-03-08 DIAGNOSIS — I10 Essential (primary) hypertension: Secondary | ICD-10-CM

## 2013-03-08 NOTE — Progress Notes (Signed)
   Subjective:    Patient ID: Emily Pham, female    DOB: [birth year], 33 y.o.   MRN: [id number]  HPI HTN- chronic problem, on HCTZ daily.  Pt was in jeopardy of being laid off and work environment is very stressful.  It is not sleeping, taking OTC sleep aid, smoking marijuana.    Nasal congestion- nasal drainage, no sinus pain/pressure.  No fever.  Review of Systems For ROS see HPI     Objective:   Physical Exam  Vitals reviewed. Constitutional: She is oriented to person, place, and time. She appears well-developed and well-nourished. No distress.  HENT:  Head: Normocephalic and atraumatic.  Right Ear: Tympanic membrane normal.  Left Ear: Tympanic membrane normal.  Nose: Mucosal edema and rhinorrhea present. Right sinus exhibits no maxillary sinus tenderness and no frontal sinus tenderness. Left sinus exhibits no maxillary sinus tenderness and no frontal sinus tenderness.  Mouth/Throat: Mucous membranes are normal. Posterior oropharyngeal erythema (w/ PND) present.  Eyes: Conjunctivae and EOM are normal. Pupils are equal, round, and reactive to light.  Neck: Normal range of motion. Neck supple.  Cardiovascular: Normal rate, regular rhythm and normal heart sounds.   Pulmonary/Chest: Effort normal and breath sounds normal. No respiratory distress. She has no wheezes. She has no rales.  Musculoskeletal: She exhibits no edema.  Lymphadenopathy:    She has no cervical adenopathy.  Neurological: She is alert and oriented to person, place, and time. No cranial nerve deficit. Coordination normal.  Skin: Skin is warm and dry.  Psychiatric: She has a normal mood and affect. Her behavior is normal.          Assessment & Plan:

## 2013-03-08 NOTE — Progress Notes (Signed)
Pre visit review using our clinic review tool, if applicable. No additional management support is needed unless otherwise documented below in the visit note. 

## 2013-03-08 NOTE — Assessment & Plan Note (Signed)
New.  Start OTC antihistamine.  Will follow.

## 2013-03-08 NOTE — Assessment & Plan Note (Signed)
Pt's BP is slightly elevated but not enough to change meds at this time.  Suspect this is stress related.  Encouraged her to find outlet.  Will have pt return to reassess in 4-6 weeks.  Reviewed supportive care and red flags that should prompt return.  Pt expressed understanding and is in agreement w/ plan.

## 2013-03-08 NOTE — Patient Instructions (Signed)
Follow up in 4-6 weeks to recheck BP We'll notify you of your lab results and make any changes if needed Start Claritin or Zyrtec daily for seasonal allergies and nasal congestion Drink plenty of fluids Try and find a stress outlet Call with any questions or concerns Happy Holidays!!!

## 2013-03-09 ENCOUNTER — Ambulatory Visit: Payer: Managed Care, Other (non HMO) | Admitting: Family Medicine

## 2013-03-09 ENCOUNTER — Telehealth: Payer: Self-pay | Admitting: Family Medicine

## 2013-03-09 LAB — BASIC METABOLIC PANEL
BUN: 11 mg/dL (ref 6–23)
Calcium: 9.3 mg/dL (ref 8.4–10.5)
GFR: 99.88 mL/min (ref >60.00)
Glucose, Bld: 95 mg/dL (ref 70–99)
Potassium: 3.6 mEq/L (ref 3.5–5.1)
Sodium: 138 mEq/L (ref 135–145)

## 2013-03-09 MED ORDER — AMOXICILLIN 875 MG PO TABS: 875.0000 mg | ORAL_TABLET | Freq: Two times a day (BID) | ORAL | Status: DC

## 2013-03-09 MED ORDER — FLUCONAZOLE 150 MG PO TABS: 150.0000 mg | ORAL_TABLET | Freq: Once | ORAL | Status: DC

## 2013-03-09 NOTE — Telephone Encounter (Signed)
Patient states that when she came in yesterday she was having sinus issues and was told to take some Claritin. Patient says that when she woke up today her face looked a little swollen and is painful to the touch. She wants to know if something can be rx'd to her pharmacy. She asks that if an antibiotic is rx'd that something for a yeast infection also be sent to her pharmacy. Please advise.

## 2013-03-09 NOTE — Telephone Encounter (Signed)
Ok for Amoxicillin 875mg  BID, #20, no refills.  Diflucan 150mg  x1 dose, no refills

## 2013-03-09 NOTE — Telephone Encounter (Signed)
Med filled and patient notified.

## 2013-03-09 NOTE — Telephone Encounter (Signed)
Please advise.//AB/CMA 

## 2013-03-10 ENCOUNTER — Telehealth: Payer: Self-pay | Admitting: *Deleted

## 2013-03-10 MED ORDER — FLUCONAZOLE 150 MG PO TABS: 150.0000 mg | ORAL_TABLET | Freq: Once | ORAL | Status: DC

## 2013-03-10 NOTE — Telephone Encounter (Signed)
Med filled.  

## 2013-03-10 NOTE — Telephone Encounter (Signed)
Ok for 2 additional Diflucan pills- take 1 tab and may repeat in 3 days if needed.

## 2013-03-10 NOTE — Telephone Encounter (Signed)
Patient notified

## 2013-03-10 NOTE — Telephone Encounter (Signed)
Patient was prescribed Fluconazole which needed a prior authorization.Patient states that she went ahead and paid out of pocket for the prescription.Patient would like to know if she can get 2 more pills called in to the pharmacy for her. Patient states that she is on the antibiotic for 10 days and believes that the 1 pill will not working because she is already starting to itch. Please advise. SW

## 2013-05-29 ENCOUNTER — Ambulatory Visit (INDEPENDENT_AMBULATORY_CARE_PROVIDER_SITE_OTHER): Payer: Managed Care, Other (non HMO) | Admitting: Family Medicine

## 2013-05-29 ENCOUNTER — Encounter: Payer: Self-pay | Admitting: Family Medicine

## 2013-05-29 VITALS — BP 142/92 | HR 78 | Temp 98.7°F | Resp 16 | Wt 212.0 lb

## 2013-05-29 DIAGNOSIS — I10 Essential (primary) hypertension: Secondary | ICD-10-CM

## 2013-05-29 DIAGNOSIS — E669 Obesity, unspecified: Secondary | ICD-10-CM | POA: Insufficient documentation

## 2013-05-29 MED ORDER — NEBIVOLOL HCL 5 MG PO TABS: 5.0000 mg | ORAL_TABLET | Freq: Every day | ORAL | Status: DC

## 2013-05-29 MED ORDER — PHENTERMINE HCL 37.5 MG PO CAPS: 37.5000 mg | ORAL_CAPSULE | ORAL | Status: DC

## 2013-05-29 NOTE — Patient Instructions (Signed)
Follow up in 3-4 weeks to recheck BP Continue the HCTZ daily Add the Bystolic for better BP and headache control Take the phentermine in the AM for appetite suppression and weight loss- if you are able to check your BP periodically before the next visit that would be great Call with any questions or concerns Hang in there!!

## 2013-05-29 NOTE — Progress Notes (Signed)
Pre visit review using our clinic review tool, if applicable. No additional management support is needed unless otherwise documented below in the visit note. 

## 2013-05-29 NOTE — Progress Notes (Signed)
   Subjective:    Patient ID: Emily Pham, female    DOB: October 16, 1980, 33 y.o.   MRN: [id number]  HPI HTN- chronic problem but BP again elevated today.  Has had HA x10-14 days.  No CP, SOB, blurry/double vision.  On HCTZ daily.  Pt feels that BP is directly related to work stress.  Obesity- chronic problem, has lost 4 lbs since last visit.  Has trip coming up in May and wants to lose '20-30 lbs'.  Wants appetite suppressant but concerned about BP.   Review of Systems For ROS see HPI     Objective:   Physical Exam  Vitals reviewed. Constitutional: She is oriented to person, place, and time. She appears well-developed and well-nourished. No distress.  obese  HENT:  Head: Normocephalic and atraumatic.  Eyes: Conjunctivae and EOM are normal. Pupils are equal, round, and reactive to light.  Neck: Normal range of motion. Neck supple. No thyromegaly present.  Cardiovascular: Normal rate, regular rhythm, normal heart sounds and intact distal pulses.   No murmur heard. Pulmonary/Chest: Effort normal and breath sounds normal. No respiratory distress.  Abdominal: Soft. She exhibits no distension. There is no tenderness.  Musculoskeletal: She exhibits no edema.  Lymphadenopathy:    She has no cervical adenopathy.  Neurological: She is alert and oriented to person, place, and time.  Skin: Skin is warm and dry.  Psychiatric: She has a normal mood and affect. Her behavior is normal.          Assessment & Plan:

## 2013-05-30 ENCOUNTER — Telehealth: Payer: Self-pay | Admitting: Family Medicine

## 2013-05-30 NOTE — Telephone Encounter (Signed)
Relevant patient education mailed to patient.  

## 2013-06-01 NOTE — Assessment & Plan Note (Signed)
Pt is working hard on healthy diet and regular exercise.  Add phentermine to assist w/ her weight loss goals.  Monitor closely for BP elevation.  Pt aware that this is possible side effect.  Will follow.

## 2013-06-01 NOTE — Assessment & Plan Note (Signed)
Unchanged.  Remains elevated today.  Add Bystolic- which may also improve anxiety.  Will follow closely and adjust meds prn.

## 2013-07-18 ENCOUNTER — Telehealth: Payer: Self-pay

## 2013-07-18 ENCOUNTER — Other Ambulatory Visit: Payer: Self-pay | Admitting: Family Medicine

## 2013-07-18 NOTE — Telephone Encounter (Signed)
Rx sent to the pharmacy by e-script.  Pt needs office visit for follow-up on blood pressure.//AB/CMA 

## 2013-07-18 NOTE — Telephone Encounter (Signed)
Pharmacy called on behalf of the patient. States that her insurance will only cover 90 day supply for HCTZ. Advised if ok to fill for a 90 day supply instead of the 3 refills. Gave ok to fill.

## 2013-10-14 ENCOUNTER — Other Ambulatory Visit: Payer: Self-pay | Admitting: Family Medicine

## 2013-10-16 NOTE — Telephone Encounter (Signed)
Med filled.  

## 2013-12-09 ENCOUNTER — Other Ambulatory Visit: Payer: Self-pay | Admitting: Family Medicine

## 2013-12-11 NOTE — Telephone Encounter (Signed)
Med filled and letter mailed to pt to schedule appt.

## 2013-12-15 ENCOUNTER — Other Ambulatory Visit: Payer: Self-pay | Admitting: General Practice

## 2013-12-15 MED ORDER — NEBIVOLOL HCL 5 MG PO TABS: ORAL_TABLET | ORAL | Status: DC

## 2014-02-06 NOTE — Telephone Encounter (Signed)
error 

## 2014-07-30 ENCOUNTER — Encounter (HOSPITAL_COMMUNITY): Payer: Self-pay | Admitting: Emergency Medicine

## 2014-07-30 ENCOUNTER — Emergency Department (INDEPENDENT_AMBULATORY_CARE_PROVIDER_SITE_OTHER)
Admission: EM | Admit: 2014-07-30 | Discharge: 2014-07-30 | Disposition: A | Discharge status: Home / Self Care | Payer: 59 | Source: Home / Self Care | Attending: Physician Assistant | Admitting: Physician Assistant

## 2014-07-30 DIAGNOSIS — R0982 Postnasal drip: Secondary | ICD-10-CM | POA: Diagnosis not present

## 2014-07-30 DIAGNOSIS — R0981 Nasal congestion: Secondary | ICD-10-CM

## 2014-07-30 DIAGNOSIS — Z91048 Other nonmedicinal substance allergy status: Secondary | ICD-10-CM | POA: Diagnosis not present

## 2014-07-30 DIAGNOSIS — Z9109 Other allergy status, other than to drugs and biological substances: Secondary | ICD-10-CM

## 2014-07-30 LAB — POCT RAPID STREP A: Streptococcus, Group A Screen (Direct): NEGATIVE

## 2014-07-30 MED ORDER — IBUPROFEN 800 MG PO TABS: 800.0000 mg | ORAL_TABLET | Freq: Three times a day (TID) | ORAL | Status: DC | PRN

## 2014-07-30 MED ORDER — MOMETASONE FUROATE 50 MCG/ACT NA SUSP: 2.0000 | Freq: Every day | NASAL | Status: DC

## 2014-07-30 NOTE — ED Notes (Signed)
C/o sore throat  States throat is swollen

## 2014-07-30 NOTE — ED Provider Notes (Signed)
CSN: [id number]     Arrival date & time 07/30/14  1733 History   First MD Initiated Contact with Patient 07/30/14 1834     Chief Complaint  Patient presents with  . Sore Throat   (Consider location/radiation/quality/duration/timing/severity/associated sxs/prior Treatment) HPI Comments: Emily Pham presents with a 5 day history of "scratchy" throat with post nasal drainage and a "fullness" feeling in the nose and throat. No fever or chills. No cough or congestion. History of allergies. Otherwise feels well. Tried Benadryl without much relief.   Patient is a 34 y.o. female presenting with pharyngitis. The history is provided by the patient.  Sore Throat    Past Medical History  Diagnosis Date  . Vitamin D deficiency   . History of chicken pox   . Hypertension    History reviewed. No pertinent past surgical history. Family History  Problem Relation Age of Onset  . Cancer Mother     Breast cancer dx age 88  . Diabetes      Grandparents  . Stroke      Grandparents   History  Substance Use Topics  . Smoking status: Never Smoker   . Smokeless tobacco: Not on file  . Alcohol Use: No   OB History    No data available     Review of Systems  Constitutional: Negative for fever and chills.  HENT: Positive for ear pain, postnasal drip and sore throat.   Eyes: Negative.   Respiratory: Negative.   Musculoskeletal: Negative.   Skin: Negative.     Allergies  Codeine  Home Medications   Prior to Admission medications   Medication Sig Start Date End Date Taking? Authorizing Provider  hydrochlorothiazide (HYDRODIURIL) 12.5 MG tablet TAKE 1 TABLET BY MOUTH DAILY    Midge Minium, MD  ibuprofen (ADVIL,MOTRIN) 800 MG tablet Take 1 tablet (800 mg total) by mouth every 8 (eight) hours as needed (sinus congestion). 07/30/14   Bjorn Pippin, PA-C  LEVORA 0.15/30, 28, 0.15-30 MG-MCG tablet  02/14/11   Historical Provider, MD  mometasone (NASONEX) 50 MCG/ACT nasal spray Place 2  sprays into the nose daily. 07/30/14   Bjorn Pippin, PA-C  nebivolol (BYSTOLIC) 5 MG tablet TAKE 1 TABLET BY MOUTH EVERY DAY 12/15/13   Midge Minium, MD  omeprazole (PRILOSEC) 20 MG capsule Take 20 mg by mouth daily.    Historical Provider, MD  phentermine 37.5 MG capsule Take 1 capsule (37.5 mg total) by mouth every morning. 05/29/13   Midge Minium, MD   BP 138/70 mmHg  Pulse 67  Temp(Src) 98.8 F (37.1 C) (Oral)  Resp 18  SpO2 97%  LMP 06/30/2014 Physical Exam  Constitutional: Emily Pham is oriented to person, place, and time. Emily Pham appears well-developed and well-nourished. No distress.  HENT:  Head: Normocephalic and atraumatic.  Right Ear: External ear normal.  Left Ear: External ear normal.  Mild injection in the oropharynx without exudate, nasal turbinates with erythema and swelling, clear drainage  Neck: Normal range of motion.  Mild tonsillar LA, non-tender  Neurological: Emily Pham is alert and oriented to person, place, and time.  Skin: Skin is warm and dry. No rash noted. Emily Pham is not diaphoretic.  Psychiatric: Emily Pham behavior is normal.  Nursing note and vitals reviewed.   ED Course  Procedures (including critical care time) Labs Review Labs Reviewed - No data to display  Imaging Review No results found.   MDM   1. Environmental allergies   2. Post-nasal drip   3.  Sinus congestion   No evidence of infection. Exam suggests allergies. No indication for an antibiotics. F/U if worsens. Use Motrin and Nasonex for sinus inflammation.    Bjorn Pippin, PA-C 07/30/14 [phone]

## 2014-07-30 NOTE — Discharge Instructions (Signed)
Allergies ° Allergies may happen from anything your body is sensitive to. This may be food, medicines, pollens, chemicals, and many other things. Food allergies can be severe and deadly.  °HOME CARE °· If you do not know what causes a reaction, keep a diary. Write down the foods you ate and the symptoms that followed. Avoid foods that cause reactions. °· If you have red raised spots (hives) or a rash: °¨ Take medicine as told by your doctor. °¨ Use medicines for red raised spots and itching as needed. °¨ Apply cold cloths (compresses) to the skin. Take a cool bath. Avoid hot baths or showers. °· If you are severely allergic: °¨ It is often necessary to go to the hospital after you have treated your reaction. °¨ Wear your medical alert jewelry. °¨ You and your family must learn how to give a allergy shot or use an allergy kit (anaphylaxis kit). °¨ Always carry your allergy kit or shot with you. Use this medicine as told by your doctor if a severe reaction is occurring. °GET HELP RIGHT AWAY IF: °· You have trouble breathing or are making high-pitched whistling sounds (wheezing). °· You have a tight feeling in your chest or throat. °· You have a puffy (swollen) mouth. °· You have red raised spots, puffiness (swelling), or itching all over your body. °· You have had a severe reaction that was helped by your allergy kit or shot. The reaction can return once the medicine has worn off. °· You think you are having a food allergy. Symptoms most often happen within 30 minutes of eating a food. °· Your symptoms have not gone away within 2 days or are getting worse. °· You have new symptoms. °· You want to retest yourself with a food or drink you think causes an allergic reaction. Only do this under the care of a doctor. °MAKE SURE YOU:  °· Understand these instructions. °· Will watch your condition. °· Will get help right away if you are not doing well or get worse. °Document Released: 07/11/2012 Document Reviewed:  07/11/2012 °Exit[employer]® Patient Information ©2015 [employer]. This information is not intended to replace advice given to you by your health care provider. Make sure you discuss any questions you have with your health care provider. ° ° ° ° °Your symptoms appear to be related to your allergies and non-infectious. Use Ibuprofen 800mg every 8 hours for sinus inflammation. Use the Nasonex once or twice a day for inflammation as well. Should you worsen or develop fevers then please return. No signs of infection.  ° °

## 2014-08-01 LAB — CULTURE, GROUP A STREP: STREP A CULTURE: NEGATIVE

## 2015-07-22 ENCOUNTER — Ambulatory Visit (INDEPENDENT_AMBULATORY_CARE_PROVIDER_SITE_OTHER): Payer: BC Managed Care – PPO | Admitting: Physician Assistant

## 2015-07-22 VITALS — BP 112/74 | HR 88 | Temp 98.9°F | Resp 18 | Wt 210.0 lb

## 2015-07-22 DIAGNOSIS — R52 Pain, unspecified: Secondary | ICD-10-CM

## 2015-07-22 DIAGNOSIS — J069 Acute upper respiratory infection, unspecified: Secondary | ICD-10-CM

## 2015-07-22 LAB — POCT CBC
GRANULOCYTE PERCENT: 33.5 % — AB (ref 37–80)
HCT, POC: 42.9 % (ref 37.7–47.9)
Hemoglobin: 14.6 g/dL (ref 12.2–16.2)
Lymph, poc: 1.9 (ref 0.6–3.4)
MCH, POC: 29.1 pg (ref 27–31.2)
MCHC: 34.1 g/dL (ref 31.8–35.4)
MCV: 85.1 fL (ref 80–97)
MID (cbc): 0.2 (ref 0–0.9)
MPV: 8.1 fL (ref 0–99.8)
POC Granulocyte: 1.1 — AB (ref 2–6.9)
POC LYMPH %: 60.6 % — AB (ref 10–50)
POC MID %: 5.9 %M (ref 0–12)
Platelet Count, POC: 241 10*3/uL (ref 142–424)
RBC: 5.03 M/uL (ref 4.04–5.48)
RDW, POC: 13.8 %
WBC: 3.2 10*3/uL — AB (ref 4.6–10.2)

## 2015-07-22 MED ORDER — HYDROCOD POLST-CPM POLST ER 10-8 MG/5ML PO SUER: 5.0000 mL | Freq: Two times a day (BID) | ORAL | Status: DC | PRN

## 2015-07-22 MED ORDER — IBUPROFEN 800 MG PO TABS: 800.0000 mg | ORAL_TABLET | Freq: Three times a day (TID) | ORAL | Status: DC | PRN

## 2015-07-22 NOTE — Patient Instructions (Addendum)
Drink plenty of water (64 oz/day) and get plenty of rest. If you have been prescribed a cough syrup, do not drive or operate heavy machinery while using this medication. Continue mucinex twice a day. Continue ibuprofen. If your symptoms are not improving in 1 week, return to clinic.     IF you received an x-ray today, you will receive an invoice from [facility]. Please contact [facility] at [phone] with questions or concerns regarding your invoice.   IF you received labwork today, you will receive an invoice from [employer]. Please contact [facility] at [phone] with questions or concerns regarding your invoice.   Our billing staff will not be able to assist you with questions regarding bills from these companies.  You will be contacted with the lab results as soon as they are available. The fastest way to get your results is to activate your My Chart account. Instructions are located on the last page of this paperwork. If you have not heard from [place] regarding the results in 2 weeks, please contact this office.

## 2015-07-22 NOTE — Progress Notes (Signed)
[facility] [address] [place] [address] [phone]  Date:  07/22/2015   Name:  Emily Pham   DOB:  April 04, 1980   MRN:  [id number]  PCP:  Annye Asa, MD    Chief Complaint: Sore Throat; Headache; Generalized Body Aches; and Chills   History of Present Illness:  This is a 35 y.o. female who is presenting with sore throat, headache, body aches and chills x 4-5 days. No documented fevers. Having a cough that is mostly dry. Some sob with exertion. Lives on 3rd floor and states she feels like is going to pass out when she gets to the top. Mild nasal congestion.  Aggravating/alleviating factors: ibuprofen 800 mg q 8 hours. mucinex 12 hours. States she feels much better on the ibuprofen. 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 Pt is wanting to be tested for the flu.  Pt states she has never had codeine before, she just lists as an allergy bc her mom is allergic. She states she has had hydrocodone before and has done well with it.  Review of Systems:  Review of Systems See HPI  Patient Active Problem List   Diagnosis Date Noted  . Obesity 05/29/2013  . Allergic rhinitis 03/08/2013  . Routine general medical examination at a health care facility 09/08/2012  . HTN (hypertension) 02/11/2012  . Breast pain 02/26/2011  . Epigastric pain 09/15/2010  . DEPRESSIVE DISORDER 04/11/2010  . GERD 04/11/2010    Prior to Admission medications   Medication Sig Start Date End Date Taking? Authorizing Provider  omeprazole (PRILOSEC) 20 MG capsule Take 20 mg by mouth daily. Reported on 07/22/2015   Yes Historical Provider, MD       Midge Minium, MD  ibuprofen (ADVIL,MOTRIN) 800 MG tablet Take 1 tablet (800 mg total) by mouth every 8 (eight) hours as needed (sinus congestion).  07/30/14  yes Bjorn Pippin, PA-C     Historical Provider, MD       Bjorn Pippin, PA-C       Midge Minium, MD       Midge Minium, MD    Allergies  Allergen Reactions  . Codeine     History reviewed. No pertinent past surgical history.  Social History  Substance Use Topics  . Smoking status: Never Smoker   . Smokeless tobacco: None  . Alcohol Use: No    Family History  Problem Relation Age of Onset  . Cancer Mother     Breast cancer dx age 23  . Diabetes      Grandparents  . Stroke      Grandparents    Medication list has been reviewed and updated.  Physical Examination:  Physical Exam  Constitutional: She is oriented to person, place, and time. She appears well-developed and well-nourished. No distress.  HENT:  Head: Normocephalic and atraumatic.  Right Ear: Hearing, tympanic membrane, external ear and ear canal normal.  Left Ear: Hearing, tympanic membrane, external ear and ear canal normal.  Nose: Mucosal edema present.  Mouth/Throat: Uvula is midline and mucous membranes are normal. Posterior oropharyngeal erythema (mild) present. No oropharyngeal exudate or posterior oropharyngeal edema.  Eyes: Conjunctivae and lids are normal. Right eye exhibits no discharge. Left eye exhibits no discharge. No scleral icterus.  Cardiovascular: Normal rate, regular rhythm, normal  heart sounds and normal pulses.   No murmur heard. Pulmonary/Chest: Effort normal and breath sounds normal. No respiratory distress. She has no wheezes. She has no rhonchi. She has no rales.  Musculoskeletal: Normal range of motion.  Lymphadenopathy:       Head (right side): No submental, no submandibular and no tonsillar adenopathy present.       Head (left side): No submental, no submandibular and no tonsillar adenopathy present.    She has no cervical adenopathy.  Neurological: She is alert and oriented to person, place, and time.  Skin: Skin is warm, dry and intact. No lesion and no rash noted.  Psychiatric: She has  a normal mood and affect. Her speech is normal and behavior is normal. Thought content normal.   BP 112/74 mmHg  Pulse 88  Temp(Src) 98.9 F (37.2 C) (Oral)  Resp 18  Wt 210 lb (95.255 kg)  SpO2 90%  LMP 07/08/2015  Results for orders placed or performed in visit on 07/22/15  POCT CBC  Result Value Ref Range   WBC 3.2 (A) 4.6 - 10.2 K/uL   Lymph, poc 1.9 0.6 - 3.4   POC LYMPH PERCENT 60.6 (A) 10 - 50 %L   MID (cbc) 0.2 0 - 0.9   POC MID % 5.9 0 - 12 %M   POC Granulocyte 1.1 (A) 2 - 6.9   Granulocyte percent 33.5 (A) 37 - 80 %G   RBC 5.03 4.04 - 5.48 M/uL   Hemoglobin 14.6 12.2 - 16.2 g/dL   HCT, POC 42.9 37.7 - 47.9 %   MCV 85.1 80 - 97 fL   MCH, POC 29.1 27 - 31.2 pg   MCHC 34.1 31.8 - 35.4 g/dL   RDW, POC 13.8 %   Platelet Count, POC 241 142 - 424 K/uL   MPV 8.1 0 - 99.8 fL   Assessment and Plan:  1. Body aches 2. Viral URI CBC indicates likely viral URI, possible flu. Focus is on supportive care, see meds prescribed below. Return in 1 week if symptoms do not improve or at any time if symptoms worsen.  - POCT CBC - chlorpheniramine-HYDROcodone (TUSSIONEX PENNKINETIC ER) 10-8 MG/5ML SUER; Take 5 mLs by mouth every 12 (twelve) hours as needed for cough.  Dispense: 100 mL; Refill: 0 - ibuprofen (ADVIL,MOTRIN) 800 MG tablet; Take 1 tablet (800 mg total) by mouth every 8 (eight) hours as needed (sinus congestion).  Dispense: 21 tablet; Refill: 0   Benjaman Pott. Drenda Freeze, MHS [facility]  07/22/2015

## 2017-01-11 ENCOUNTER — Encounter: Payer: Self-pay | Admitting: Family Medicine

## 2017-01-11 ENCOUNTER — Ambulatory Visit (INDEPENDENT_AMBULATORY_CARE_PROVIDER_SITE_OTHER): Payer: BC Managed Care – PPO | Admitting: Family Medicine

## 2017-01-11 ENCOUNTER — Other Ambulatory Visit (HOSPITAL_COMMUNITY)
Admission: RE | Admit: 2017-01-11 | Discharge: 2017-01-11 | Disposition: A | Discharge status: Home / Self Care | Payer: BC Managed Care – PPO | Source: Ambulatory Visit | Attending: Family Medicine | Admitting: Family Medicine

## 2017-01-11 VITALS — BP 132/82 | HR 94 | Temp 98.8°F | Ht 63.5 in | Wt 188.5 lb

## 2017-01-11 DIAGNOSIS — Z23 Encounter for immunization: Secondary | ICD-10-CM | POA: Diagnosis not present

## 2017-01-11 DIAGNOSIS — Z0001 Encounter for general adult medical examination with abnormal findings: Secondary | ICD-10-CM

## 2017-01-11 DIAGNOSIS — R634 Abnormal weight loss: Secondary | ICD-10-CM

## 2017-01-11 DIAGNOSIS — Z202 Contact with and (suspected) exposure to infections with a predominantly sexual mode of transmission: Secondary | ICD-10-CM | POA: Diagnosis not present

## 2017-01-11 DIAGNOSIS — Z0000 Encounter for general adult medical examination without abnormal findings: Secondary | ICD-10-CM

## 2017-01-11 MED ORDER — AZITHROMYCIN 250 MG PO TABS
1000.0000 mg | ORAL_TABLET | Freq: Every day | ORAL | Status: DC
  Administered 2017-01-11: 1000 mg via ORAL

## 2017-01-11 MED ORDER — CEFTRIAXONE SODIUM 250 MG IJ SOLR
250.0000 mg | Freq: Once | INTRAMUSCULAR | Status: AC
  Administered 2017-01-11: 250 mg via INTRAMUSCULAR

## 2017-01-11 NOTE — Patient Instructions (Signed)
Aim to do some physical exertion for 150 minutes per week. This is typically divided into 5 days per week, 30 minutes per day. The activity should be enough to get your heart rate up. Anything is better than nothing if you have time constraints. Lift some weights.  Healthy Eating Plan Many factors influence your heart health, including eating and exercise habits. Heart (coronary) risk increases with abnormal blood fat (lipid) levels. Heart-healthy meal planning includes limiting unhealthy fats, increasing healthy fats, and making other small dietary changes. This includes maintaining a healthy body weight to help keep lipid levels within a normal range.  WHAT IS MY PLAN?  Your health care provider recommends that you:  Drink a glass of water before meals to help with satiety.  Eat slowly.  An alternative to the water is to add Metamucil. This will help with satiety as well. It does contain calories, unlike water.  WHAT TYPES OF FAT SHOULD I CHOOSE?  Choose healthy fats more often. Choose monounsaturated and polyunsaturated fats, such as olive oil and canola oil, flaxseeds, walnuts, almonds, and seeds.  Eat more omega-3 fats. Good choices include salmon, mackerel, sardines, tuna, flaxseed oil, and ground flaxseeds. Aim to eat fish at least two times each week.  Avoid foods with partially hydrogenated oils in them. These contain trans fats. Examples of foods that contain trans fats are stick margarine, some tub margarines, cookies, crackers, and other baked goods. If you are going to avoid a fat, this is the one to avoid!  WHAT GENERAL GUIDELINES DO I NEED TO FOLLOW?  Check food labels carefully to identify foods with trans fats. Avoid these types of options when possible.  Fill one half of your plate with vegetables and green salads. Eat 4-5 servings of vegetables per day. A serving of vegetables equals 1 cup of raw leafy vegetables,  cup of raw or cooked cut-up vegetables, or  cup of  vegetable juice.  Fill one fourth of your plate with whole grains. Look for the word "whole" as the first word in the ingredient list.  Fill one fourth of your plate with lean protein foods.  Eat 4-5 servings of fruit per day. A serving of fruit equals one medium whole fruit,  cup of dried fruit,  cup of fresh, frozen, or canned fruit. Try to avoid fruits in cups/syrups as the sugar content can be high.  Eat more foods that contain soluble fiber. Examples of foods that contain this type of fiber are apples, broccoli, carrots, beans, peas, and barley. Aim to get 20-30 g of fiber per day.  Eat more home-cooked food and less restaurant, buffet, and fast food.  Limit or avoid alcohol.  Limit foods that are high in starch and sugar.  Avoid fried foods when able.  Cook foods by using methods other than frying. Baking, boiling, grilling, and broiling are all great options. Other fat-reducing suggestions include: ? Removing the skin from poultry. ? Removing all visible fats from meats. ? Skimming the fat off of stews, soups, and gravies before serving them. ? Steaming vegetables in water or broth.  Lose weight if you are overweight. Losing just 5-10% of your initial body weight can help your overall health and prevent diseases such as diabetes and heart disease.  Increase your consumption of nuts, legumes, and seeds to 4-5 servings per week. One serving of dried beans or legumes equals  cup after being cooked, one serving of nuts equals 1 ounces, and one serving of seeds equals  ounce or 1 tablespoon.  WHAT ARE GOOD FOODS CAN I EAT? Grains Grainy breads (try to find bread that is 3 g of fiber per slice or greater), oatmeal, light popcorn. Whole-grain cereals. Rice and pasta, including brown rice and those that are made with whole wheat. Edamame pasta is a great alternative to grain pasta. It has a higher protein content. Try to avoid significant consumption of white bread, sugary cereals,  or pastries/baked goods.  Vegetables All vegetables. Cooked white potatoes do not count as vegetables.  Fruits All fruits, but limit pineapple and bananas as these fruits have a higher sugar content.  Meats and Other Protein Sources Lean, well-trimmed beef, veal, pork, and lamb. Chicken and [place] without skin. All fish and shellfish. Wild duck, rabbit, pheasant, and venison. Egg whites or low-cholesterol egg substitutes. Dried beans, peas, lentils, and tofu.Seeds and most nuts.  Dairy Low-fat or nonfat cheeses, including ricotta, string, and mozzarella. Skim or 1% milk that is liquid, powdered, or evaporated. Buttermilk that is made with low-fat milk. Nonfat or low-fat yogurt. Soy/Almond milk are good alternatives if you cannot handle dairy.  Beverages Water is the best for you. Sports drinks with less sugar are more desirable unless you are a highly active athlete.  Sweets and Desserts Sherbets and fruit ices. Honey, jam, marmalade, jelly, and syrups. Dark chocolate.  Eat all sweets and desserts in moderation.  Fats and Oils Nonhydrogenated (trans-free) margarines. Vegetable oils, including soybean, sesame, sunflower, olive, peanut, safflower, corn, canola, and cottonseed. Salad dressings or mayonnaise that are made with a vegetable oil. Limit added fats and oils that you use for cooking, baking, salads, and as spreads.  Other Cocoa powder. Coffee and tea. Most condiments.  The items listed above may not be a complete list of recommended foods or beverages. Contact your dietitian for more options.

## 2017-01-11 NOTE — Progress Notes (Signed)
Pre visit review using our clinic review tool, if applicable. No additional management support is needed unless otherwise documented below in the visit note. 

## 2017-01-11 NOTE — Progress Notes (Addendum)
Chief Complaint  Patient presents with  . Establish Care     Well Woman Emily Pham is here for a complete physical.   Her last physical was >1 year ago.  Current diet: in general, a "healthy" diet. Current exercise: some squatting and walking; physically active at school. Weight is decreasing; she denies daytime fatigue. No LMP recorded.  Seatbelt? Yes  She is a month history of progressive unintentional weight loss. Due to various events, she has been increasingly stressed out. Her parents believe that it may be due to anxiety/stress. She does admit to being an anxious person. She denies any depressive symptoms. She's never been on any medicine for this type of condition. She's been feeling better over the last couple months. Sometimes she will forget to eat. She denies any pain, bleeding, night sweats, cough, or shortness of breath. She doesn't have some easy bruising.  She also notes that she's been sexually active with partner recently. She tested positive for chlamydia around 1-1/2 months ago. She's having similar symptoms (back pain and abd discomfort) and wishes to be rechecked. Her partner has not been complaining of any symptoms.  Health Maintenance Pap/HPV- Yes  Tetanus- No HIV- Yes  Past Medical History:  Diagnosis Date  . History of chicken pox   . Hypertension   . Vitamin D deficiency     Past Surgical History:  Procedure Laterality Date  . NO PAST SURGERIES       Medications  Current Outpatient Prescriptions on File Prior to Visit  Medication Sig Dispense Refill  . omeprazole (PRILOSEC) 20 MG capsule Take 20 mg by mouth daily. Reported on 07/22/2015     Allergies Allergies  Allergen Reactions  . Codeine     Famhx Family History  Problem Relation Age of Onset  . Cancer Mother        Breast cancer dx age 22  . Arthritis Mother   . Diabetes Unknown        Grandparents  . Stroke Unknown        Grandparents  . Arthritis Father   . Diabetes Maternal  Grandmother   . Hypertension Maternal Grandmother    Social hx Social History   Social History  . Marital status: Single   Social History Main Topics  . Smoking status: Never Smoker  . Smokeless tobacco: Never Used  . Alcohol use No  . Drug use: No     Review of Systems: Constitutional:  +unexpected change in weight, no weakness, no unexplained fevers, sweats, or chills Eye:  no recent significant change in vision Ear/Nose/Mouth/Throat:  Ears:  no tinnitus or vertigo and no recent change in hearing, Nose/Mouth/Throat:  no complaints of nasal congestion or discharge, no sore throat and no recent change in voice or hoarseness Cardiovascular:  no exercise intolerance, no chest pain, no palpitations Respiratory:  no chronic cough, sputum, or hemoptysis and no shortness of breath Gastrointestinal:  no abdominal pain, no change in bowel habits, no significant change in appetite, no nausea, vomiting, diarrhea, or constipation and no black or bloody stool GU:  Female: negative for dysuria, frequency, and incontinence, Normal menses; no abnormal bleeding, pelvic pain, or discharge Musculoskeletal/Extremities:  no pain, redness, or swelling of the joints Integumentary (Skin/Breast):  no abnormal skin lesions reported, no new breast lumps or masses Neurologic:  no chronic headaches, no numbness, tingling, or tremor Psychiatric:  +anxiety, no depression Endocrine:  denies fatigue, weight changes, heat/cold intolerance, bowel or skin changes, or cardiovascular system symptoms  Hematologic/Lymphatic:  no abnormal bleeding  Exam BP 132/82 (BP Location: Left Arm, Patient Position: Sitting, Cuff Size: Normal)   Pulse 94   Temp 98.8 F (37.1 C) (Oral)   Ht 5' 3.5" (1.613 m)   Wt 188 lb 8 oz (85.5 kg)   SpO2 97%   BMI 32.87 kg/m  General:  well developed, well nourished, in no apparent distress Skin:  no significant moles, warts, or growths Head:  no masses, lesions, or tenderness Eyes:   pupils equal and round, sclera anicteric without injection Ears:  canals without lesions, TMs shiny without retraction, no obvious effusion, no erythema Nose:  nares patent, septum midline, mucosa normal, and no drainage or sinus tenderness Throat/Pharynx:  lips and gingiva without lesion; tongue and uvula midline; non-inflamed pharynx; no exudates or postnasal drainage Neck: neck supple without adenopathy, thyromegaly, or masses Breasts: Deferred to GYN Thorax:  nontender Lungs:  clear to auscultation, breath sounds equal bilaterally, no respiratory distress Cardio:  regular rate and rhythm without murmurs, heart sounds without clicks or rubs, point of maximal impulse normal; no lifts, heaves, or thrills Abdomen:  abdomen soft, nontender; bowel sounds normal; no masses or organomegaly Genital: Defer to GYN Extremities:  no clubbing, cyanosis, or edema, no deformities, no skin discoloration Neuro:  gait normal; deep tendon reflexes normal and symmetric Psych: well oriented with normal range of affect and appropriate judgment/insight  Assessment and Plan  Well adult exam - Plan: CBC, Comprehensive metabolic panel, Lipid panel  Unintentional weight loss - Plan: TSH  Exposure to chlamydia - Plan: Urine cytology ancillary only, cefTRIAXone (ROCEPHIN) injection 250 mg, azithromycin (ZITHROMAX) tablet 1,000 mg  Need for Tdap vaccination - Plan: Tdap vaccine greater than or equal to 63yo IM   Well 36 y.o. female. Loss is most concerning for untreated anxiety. We will check for any metabolic underlying issues. She is asymptomatically, should she have any symptoms arise, she will let us know. Counseled on diet and exercise. Diet handout given. She was encouraged to lift weights. Other orders as above. Treat empirically for gonorrhea and chlamydia. Urine ancillary. Follow up in 1 year pending above. The patient voiced understanding and agreement to the plan.  [place],  DO 01/11/17 4:50 PM

## 2017-01-12 LAB — LIPID PANEL
CHOLESTEROL: 211 mg/dL — AB (ref 0–200)
HDL: 104.8 mg/dL (ref >39.00)
LDL Cholesterol: 96 mg/dL (ref 0–99)
NonHDL: 106.14
Total CHOL/HDL Ratio: 2
Triglycerides: 52 mg/dL (ref 0.0–149.0)
VLDL: 10.4 mg/dL (ref 0.0–40.0)

## 2017-01-12 LAB — COMPREHENSIVE METABOLIC PANEL
ALBUMIN: 4.2 g/dL (ref 3.5–5.2)
ALK PHOS: 55 U/L (ref 39–117)
ALT: 7 U/L (ref 0–35)
AST: 12 U/L (ref 0–37)
BUN: 13 mg/dL (ref 6–23)
CALCIUM: 9.7 mg/dL (ref 8.4–10.5)
CO2: 26 mEq/L (ref 19–32)
Chloride: 103 mEq/L (ref 96–112)
Creatinine, Ser: 0.8 mg/dL (ref 0.40–1.20)
GFR: 104.66 mL/min (ref >60.00)
Glucose, Bld: 96 mg/dL (ref 70–99)
POTASSIUM: 3.8 meq/L (ref 3.5–5.1)
Sodium: 140 mEq/L (ref 135–145)
Total Bilirubin: 0.3 mg/dL (ref 0.2–1.2)
Total Protein: 7.6 g/dL (ref 6.0–8.3)

## 2017-01-12 LAB — CBC
HEMATOCRIT: 40.4 % (ref 36.0–46.0)
HEMOGLOBIN: 13.3 g/dL (ref 12.0–15.0)
MCHC: 32.9 g/dL (ref 30.0–36.0)
MCV: 91.5 fl (ref 78.0–100.0)
Platelets: 377 10*3/uL (ref 150.0–400.0)
RBC: 4.42 Mil/uL (ref 3.87–5.11)
RDW: 15.1 % (ref 11.5–15.5)
WBC: 7.9 10*3/uL (ref 4.0–10.5)

## 2017-01-12 LAB — TSH: TSH: 0.91 u[IU]/mL (ref 0.35–4.50)

## 2017-01-13 LAB — URINE CYTOLOGY ANCILLARY ONLY
Chlamydia: NEGATIVE
NEISSERIA GONORRHEA: NEGATIVE
Trichomonas: NEGATIVE

## 2018-10-17 ENCOUNTER — Other Ambulatory Visit: Payer: Self-pay | Admitting: *Deleted

## 2018-10-17 DIAGNOSIS — Z20822 Contact with and (suspected) exposure to covid-19: Secondary | ICD-10-CM

## 2018-10-17 NOTE — Addendum Note (Signed)
Addended by: Brigitte Pulse on: 10/17/2018 08:15 PM   Modules accepted: Orders

## 2018-10-17 NOTE — Progress Notes (Signed)
lab

## 2018-10-19 LAB — NOVEL CORONAVIRUS, NAA: SARS-CoV-2, NAA: NOT DETECTED

## 2019-05-31 ENCOUNTER — Ambulatory Visit
Admission: EM | Admit: 2019-05-31 | Discharge: 2019-05-31 | Disposition: A | Discharge status: Home / Self Care | Payer: BC Managed Care – PPO | Attending: Emergency Medicine | Admitting: Emergency Medicine

## 2019-05-31 DIAGNOSIS — R1011 Right upper quadrant pain: Secondary | ICD-10-CM | POA: Diagnosis not present

## 2019-05-31 LAB — CBC WITH DIFFERENTIAL/PLATELET
Basophils Absolute: 0 10*3/uL (ref 0.0–0.2)
Basos: 1 %
EOS (ABSOLUTE): 0 10*3/uL (ref 0.0–0.4)
Eos: 1 %
Hematocrit: 42.9 % (ref 34.0–46.6)
Hemoglobin: 14.1 g/dL (ref 11.1–15.9)
Immature Grans (Abs): 0 10*3/uL (ref 0.0–0.1)
Immature Granulocytes: 0 %
Lymphocytes Absolute: 2.3 10*3/uL (ref 0.7–3.1)
Lymphs: 41 %
MCH: 29.9 pg (ref 26.6–33.0)
MCHC: 32.9 g/dL (ref 31.5–35.7)
MCV: 91 fL (ref 79–97)
Monocytes Absolute: 0.4 10*3/uL (ref 0.1–0.9)
Monocytes: 6 %
Neutrophils Absolute: 2.9 10*3/uL (ref 1.4–7.0)
Neutrophils: 51 %
Platelets: 356 10*3/uL (ref 150–450)
RBC: 4.72 x10E6/uL (ref 3.77–5.28)
RDW: 13.1 % (ref 11.7–15.4)
WBC: 5.6 10*3/uL (ref 3.4–10.8)

## 2019-05-31 LAB — AMYLASE: Amylase: 97 U/L (ref 31–110)

## 2019-05-31 LAB — COMPREHENSIVE METABOLIC PANEL
ALT: 7 IU/L (ref 0–32)
AST: 16 IU/L (ref 0–40)
Albumin/Globulin Ratio: 1.6 (ref 1.2–2.2)
Albumin: 4.2 g/dL (ref 3.8–4.8)
Alkaline Phosphatase: 66 IU/L (ref 39–117)
BUN/Creatinine Ratio: 10 (ref 9–23)
BUN: 8 mg/dL (ref 6–20)
Bilirubin Total: 0.3 mg/dL (ref 0.0–1.2)
CO2: 23 mmol/L (ref 20–29)
Calcium: 9.8 mg/dL (ref 8.7–10.2)
Chloride: 105 mmol/L (ref 96–106)
Creatinine, Ser: 0.84 mg/dL (ref 0.57–1.00)
GFR calc Af Amer: 103 mL/min/{1.73_m2} (ref >59)
GFR calc non Af Amer: 89 mL/min/{1.73_m2} (ref >59)
Globulin, Total: 2.6 g/dL (ref 1.5–4.5)
Glucose: 91 mg/dL (ref 65–99)
Potassium: 4.2 mmol/L (ref 3.5–5.2)
Sodium: 141 mmol/L (ref 134–144)
Total Protein: 6.8 g/dL (ref 6.0–8.5)

## 2019-05-31 LAB — LIPASE: Lipase: 40 U/L (ref 14–72)

## 2019-05-31 MED ORDER — OMEPRAZOLE 40 MG PO CPDR: 40.0000 mg | DELAYED_RELEASE_CAPSULE | Freq: Every day | ORAL | 0 refills | Status: DC

## 2019-05-31 NOTE — Discharge Instructions (Addendum)
Blood work pending: will call you tomorrow with results. Take medication as prescribed. Keep symptoms log & review with PCP at next appointment. Go to ER for worsening pain, fever, yellowing of eye, vomiting.

## 2019-05-31 NOTE — ED Provider Notes (Signed)
[facility]    CSN: [id number] Arrival date & time: 05/31/19  1801      History   Chief Complaint Chief Complaint  Patient presents with  . Abdominal Pain    HPI Emily Pham is a 39 y.o. female with history of hypertension, vitamin D deficiency presenting for epigastric tenderness for the last week.  States it radiates to RUQ.  States it has become so tender when she touches laying on her close brushing against it hurts: Denies rash or trauma to the area.  Patient does endorse history of reflux: Takes omeprazole as needed.  Tried single dose of PPI, drinking warm water, and Tylenol without significant relief.  Patient feels pain may be worse after eating, though unsure which foods specifically.  Patient denies fever, chills, arthralgias or myalgias, nausea, vomiting, lower abdominal pain, pelvic pain, vaginal discharge.  Patient denies alcohol intake, history of hypertriglyceridemia, or abdominal surgery.   Past Medical History:  Diagnosis Date  . History of chicken pox   . Hypertension   . Vitamin D deficiency     Patient Active Problem List   Diagnosis Date Noted  . Obesity 05/29/2013  . Allergic rhinitis 03/08/2013  . Routine general medical examination at a health care facility 09/08/2012  . HTN (hypertension) 02/11/2012  . Breast pain 02/26/2011  . Epigastric pain 09/15/2010  . DEPRESSIVE DISORDER 04/11/2010  . GERD 04/11/2010    Past Surgical History:  Procedure Laterality Date  . NO PAST SURGERIES      OB History   No obstetric history on file.      Home Medications    Prior to Admission medications   Medication Sig Start Date End Date Taking? Authorizing Provider  omeprazole (PRILOSEC) 40 MG capsule Take 1 capsule (40 mg total) by mouth daily. Reported on 07/22/2015 05/31/19   Hall-Potvin, [place], PA-C    Family History Family History  Problem Relation Age of Onset  . Cancer Mother        Breast cancer dx age 28  . Arthritis Mother    . Diabetes Other        Grandparents  . Stroke Other        Grandparents  . Arthritis Father   . Diabetes Maternal Grandmother   . Hypertension Maternal Grandmother     Social History Social History   Tobacco Use  . Smoking status: Never Smoker  . Smokeless tobacco: Never Used  Substance Use Topics  . Alcohol use: No  . Drug use: No     Allergies   Codeine   Review of Systems As per HPI   Physical Exam Triage Vital Signs ED Triage Vitals  Enc Vitals Group     BP      Pulse      Resp      Temp      Temp src      SpO2      Weight      Height      Head Circumference      Peak Flow      Pain Score      Pain Loc      Pain Edu?      Excl. in [place]?    No data found.  Updated Vital Signs BP (!) 154/85 (BP Location: Left Arm)   Pulse 78   Temp 99.1 F (37.3 C) (Oral)   Resp 16   LMP 05/31/2019   SpO2 97%   Visual Acuity Right  Eye Distance:   Left Eye Distance:   Bilateral Distance:    Right Eye Near:   Left Eye Near:    Bilateral Near:     Physical Exam Constitutional:      General: She is not in acute distress.    Appearance: She is well-developed. She is not ill-appearing.  HENT:     Head: Normocephalic and atraumatic.  Eyes:     General: No scleral icterus.    Pupils: Pupils are equal, round, and reactive to light.  Cardiovascular:     Rate and Rhythm: Normal rate and regular rhythm.  Pulmonary:     Effort: Pulmonary effort is normal. No respiratory distress.     Breath sounds: No wheezing.  Abdominal:     General: Bowel sounds are normal. There is no distension or abdominal bruit.     Palpations: Abdomen is soft. There is no hepatomegaly, splenomegaly or pulsatile mass.     Tenderness: There is abdominal tenderness in the right upper quadrant and epigastric area. There is no guarding or rebound. Positive signs include Murphy's sign. Negative signs include Rovsing's sign and McBurney's sign.     Hernia: No hernia is present.  Skin:     General: Skin is warm.     Coloration: Skin is not cyanotic, jaundiced or pale.  Neurological:     Mental Status: She is alert and oriented to person, place, and time.      UC Treatments / Results  Labs (all labs ordered are listed, but only abnormal results are displayed) Labs Reviewed  CBC WITH DIFFERENTIAL/PLATELET   Narrative:    Performed at:  [address], [place], [place]  [id number] Lab Director: Rush Farmer MD, Phone:  [phone]  COMPREHENSIVE METABOLIC PANEL   Narrative:    Performed at:  [address], [place], [place]  [id number] Lab Director: Rush Farmer MD, Phone:  [phone]  AMYLASE   Narrative:    Performed at:  [address], [place], [place]  [id number] Lab Director: Rush Farmer MD, Phone:  [phone]  LIPASE   Narrative:    Performed at:  [address] [place], [place]  [id number] Lab Director: Rush Farmer MD, Phone:  [phone]    EKG   Radiology No results found.  Procedures Procedures (including critical care time)  Medications Ordered in UC Medications - No data to display  Initial Impression / Assessment and Plan / UC Course  I have reviewed the triage vital signs and the nursing notes.  Pertinent labs & imaging results that were available during my care of the patient were reviewed by me and considered in my medical decision making (see chart for details).     Patient with significant RUQ tenderness positive Murphy sign.  She is afebrile, appears otherwise well.  Reporting good oral intake without nausea or vomiting, diarrhea.  Possible cholelithiasis vs gastritis vs mild acute pancreatitis we will draw basic labs to screen for acute cystitis, pancreatitis.  Will treat for gastritis and have patient follow-up with PCP/surgery as needed should symptoms persist, worsen.  Return precautions discussed, patient verbalized  understanding and is agreeable to plan. Final Clinical Impressions(s) / UC Diagnoses   Final diagnoses:  RUQ pain     Discharge Instructions     Blood work pending: will call you tomorrow with results. Take medication as prescribed. Keep symptoms log & review with PCP at next appointment. Go to ER  for worsening pain, fever, yellowing of eye, vomiting.    ED Prescriptions    Medication Sig Dispense Auth. Provider   omeprazole (PRILOSEC) 40 MG capsule Take 1 capsule (40 mg total) by mouth daily. Reported on 07/22/2015 30 capsule Hall-Potvin, [place], PA-C     PDMP not reviewed this encounter.   Hall-Potvin, [place], [place] 06/01/19 [phone]

## 2019-05-31 NOTE — ED Triage Notes (Signed)
Pt c/o tenderness to touch epigastric area across to RUQ x1wk. States pain when clothes are touching that area.

## 2019-06-01 ENCOUNTER — Encounter: Payer: Self-pay | Admitting: Emergency Medicine

## 2019-06-01 ENCOUNTER — Telehealth: Payer: Self-pay | Admitting: Emergency Medicine

## 2019-06-01 MED ORDER — SUCRALFATE 1 G PO TABS: 1.0000 g | ORAL_TABLET | Freq: Three times a day (TID) | ORAL | 0 refills | Status: DC

## 2019-06-01 NOTE — Telephone Encounter (Signed)
Patient: Inquiring about lab results.  Stated that I tried calling her this morning, the voicemail is full.  Documented results in her patient portal: Patient had not yet viewed.  All labs normal.  Patient inquiring about pain medication.  Stressed importance of taking omeprazole daily and follow-up with PCP.  Will send Carafate as discussed at visit yesterday.  Return precautions discussed, patient verbalized understanding and is agreeable to plan.

## 2019-06-15 ENCOUNTER — Telehealth: Payer: Self-pay

## 2019-06-16 ENCOUNTER — Other Ambulatory Visit: Payer: Self-pay

## 2019-06-20 ENCOUNTER — Other Ambulatory Visit: Payer: Self-pay

## 2019-06-20 NOTE — Patient Instructions (Addendum)
It was nice to meet you today- take care and please schedule your ultrasound asap.    If any acute or severe change in your symptoms in the meantime seek immediate care!

## 2019-06-20 NOTE — Progress Notes (Signed)
[place] [facility], [place], [place] [address] [phone]  Date:  06/21/2019   Name:  Emily Pham   DOB:  [birth year]   MRN:  [id number]  PCP:  Darreld Mclean, MD    Chief Complaint: Transfer Of Care and Abdominal Pain (upper abdominal pain, taking prilosec)   History of Present Illness:  Emily Pham is a 39 y.o. very pleasant female patient who presents with the following:  Here today to establish care She has been seen once previously at this office by Dr. Nani Ravens, in 2018-I agree to take her as a transfer She also previously used to see Dr. 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  This did seem to help some She will occasionally vomit if she eats too much and is uncomfortable-otherwise vomiting is not a major feature She is avoiding spicy or acidic foods and also fried foods; fatty foods do make her worse   Her mother had her gallbladder out; patient wonders if she may need same  She lost weight 2 years ago but gained it all back Wt Readings from Last 3 Encounters:  06/21/19 200 lb (90.7 kg)  01/11/17 188 lb 8 oz (85.5 kg)  07/22/15 210 lb (95.3 kg)      Patient Active Problem List   Diagnosis Date Noted  . Obesity 05/29/2013  . Allergic rhinitis 03/08/2013  . Routine general medical examination at a health care facility 09/08/2012  . HTN (hypertension) 02/11/2012  . Breast pain 02/26/2011  . Epigastric pain 09/15/2010  .  DEPRESSIVE DISORDER 04/11/2010  . GERD 04/11/2010    Past Medical History:  Diagnosis Date  . History of chicken pox   . Hypertension   . Vitamin D deficiency     Past Surgical History:  Procedure Laterality Date  . NO PAST SURGERIES      Social History   Tobacco Use  . Smoking status: Never Smoker  . Smokeless tobacco: Never Used  Substance Use Topics  . Alcohol use: No  . Drug use: No    Family History  Problem Relation Age of Onset  . Cancer Mother        Breast cancer dx age 70  . Arthritis Mother   . Diabetes Other        Grandparents  . Stroke Other        Grandparents  . Arthritis Father   . Diabetes Maternal Grandmother   . Hypertension Maternal Grandmother     Allergies  Allergen Reactions  . Codeine     Medication list has been reviewed and updated.  Current Outpatient Medications on File Prior to Visit  Medication Sig Dispense Refill  . omeprazole (PRILOSEC) 40 MG capsule Take 1 capsule (40 mg total) by mouth daily. Reported on 07/22/2015 30 capsule 0   Current Facility-Administered Medications on File Prior to Visit  Medication Dose  Route Frequency Provider Last Rate Last Admin  . azithromycin (ZITHROMAX) tablet 1,000 mg  1,000 mg Oral Daily Shelda Pal, DO   1,000 mg at 01/11/17 1633    Review of Systems:  As per HPI- otherwise negative.   Physical Examination: Vitals:   06/21/19 1407  BP: 138/90  Pulse: 66  Resp: 16  Temp: 97.6 F (36.4 C)  SpO2: 98%   Vitals:   06/21/19 1407  Weight: 200 lb (90.7 kg)  Height: 5' 3.5" (1.613 m)   Body mass index is 34.87 kg/m. Ideal Body Weight: Weight in (lb) to have BMI = 25: 143.1  GEN: no acute distress.  Obese, otherwise appears well HEENT: Atraumatic, Normocephalic.  Ears and Nose: No external deformity. CV: RRR, No M/G/R. No JVD. No thrill. No extra heart sounds. PULM: CTA B, no wheezes, crackles, rhonchi. No retractions. No resp. distress. No accessory muscle  use. ABD: S, NT, ND, +BS. No rebound. No HSM.  She has minimal epigastric and mild right upper quadrant tenderness today; per patient this has been going on for several weeks.  Positive Murphy sign EXTR: No c/c/e PSYCH: Normally interactive. Conversant.    Assessment and Plan: RUQ pain - Plan: US Abdomen Limited RUQ  Here today with abdominal pain concerning for gallbladder disease She was seen for the same issue on March 3, had labs which were normal including CBC, CMP, pancreatic enzymes  We will plan for right upper quadrant ultrasound ASAP to evaluate her gallbladder.  Patient is urged to eat a low-fat diet in the meantime to reduce stress of her gallbladder.  She is advised that if she develops more severe pain, vomiting, fever she should go to the ER  Otherwise will be in touch with her ultrasound report Moderate medical decision making today This visit occurred during the SARS-CoV-2 public health emergency.  Safety protocols were in place, including screening questions prior to the visit, additional usage of staff PPE, and extensive cleaning of exam room while observing appropriate contact time as indicated for disinfecting solutions.    Signed Lamar Blinks, MD

## 2019-06-21 ENCOUNTER — Encounter: Payer: Self-pay | Admitting: Family Medicine

## 2019-06-21 ENCOUNTER — Ambulatory Visit: Payer: BC Managed Care – PPO | Admitting: Family Medicine

## 2019-06-21 ENCOUNTER — Other Ambulatory Visit: Payer: Self-pay

## 2019-06-21 VITALS — BP 138/90 | HR 66 | Temp 97.6°F | Resp 16 | Ht 63.5 in | Wt 200.0 lb

## 2019-06-21 DIAGNOSIS — R1011 Right upper quadrant pain: Secondary | ICD-10-CM | POA: Diagnosis not present

## 2019-06-23 ENCOUNTER — Other Ambulatory Visit: Payer: Self-pay

## 2019-06-23 ENCOUNTER — Ambulatory Visit (HOSPITAL_BASED_OUTPATIENT_CLINIC_OR_DEPARTMENT_OTHER)
Admission: RE | Admit: 2019-06-23 | Discharge: 2019-06-23 | Disposition: A | Discharge status: Home / Self Care | Payer: BC Managed Care – PPO | Source: Ambulatory Visit | Attending: Family Medicine | Admitting: Family Medicine

## 2019-06-23 DIAGNOSIS — R1011 Right upper quadrant pain: Secondary | ICD-10-CM

## 2019-06-24 ENCOUNTER — Encounter: Payer: Self-pay | Admitting: Family Medicine

## 2019-06-24 MED ORDER — SUCRALFATE 1 G PO TABS
1.0000 g | ORAL_TABLET | Freq: Three times a day (TID) | ORAL | 0 refills | Status: DC
Start: 2019-06-24 — End: 2020-04-01

## 2019-07-19 ENCOUNTER — Encounter: Payer: Self-pay | Admitting: Family Medicine

## 2020-03-26 NOTE — Progress Notes (Deleted)
[place] at [facility] [address] [place], [place] [address] [phone]  Date:  03/27/2020   Name:  Emily Pham   DOB:  04-Aug-1980   MRN:  [id number]  PCP:  Darreld Mclean, MD    Chief Complaint: No chief complaint on file.   History of Present Illness:  Emily Pham is a 39 y.o. very pleasant female patient who presents with the following:  Patient today with concern of abdominal pain History of hypertension, obesity I last saw her in March 2021 at which point she had symptoms concerning for gallbladder disease However, at that time her right upper quadrant ultrasound was normal  Flu vaccine COVID-19 vaccine Patient Active Problem List   Diagnosis Date Noted  . Obesity 05/29/2013  . Allergic rhinitis 03/08/2013  . Routine general medical examination at a health care facility 09/08/2012  . HTN (hypertension) 02/11/2012  . Breast pain 02/26/2011  . Epigastric pain 09/15/2010  . DEPRESSIVE DISORDER 04/11/2010  . GERD 04/11/2010    Past Medical History:  Diagnosis Date  . History of chicken pox   . Hypertension   . Vitamin D deficiency     Past Surgical History:  Procedure Laterality Date  . NO PAST SURGERIES      Social History   Tobacco Use  . Smoking status: Never Smoker  . Smokeless tobacco: Never Used  Substance Use Topics  . Alcohol use: No  . Drug use: No    Family History  Problem Relation Age of Onset  . Cancer Mother        Breast cancer dx age 108  . Arthritis Mother   . Diabetes Other        Grandparents  . Stroke Other        Grandparents  . Arthritis Father   . Diabetes Maternal Grandmother   . Hypertension Maternal Grandmother     Allergies  Allergen Reactions  . Codeine     Medication list has been reviewed and updated.  Current Outpatient Medications on File Prior to Visit  Medication Sig Dispense Refill  . omeprazole (PRILOSEC) 40 MG capsule Take 1 capsule  (40 mg total) by mouth daily. Reported on 07/22/2015 30 capsule 0  . sucralfate (CARAFATE) 1 g tablet Take 1 tablet (1 g total) by mouth 4 (four) times daily -  with meals and at bedtime. 40 tablet 0   Current Facility-Administered Medications on File Prior to Visit  Medication Dose Route Frequency Provider Last Rate Last Admin  . azithromycin (ZITHROMAX) tablet 1,000 mg  1,000 mg Oral Daily Shelda Pal, DO   1,000 mg at 01/11/17 1633    Review of Systems:  As per HPI- otherwise negative.   Physical Examination: There were no vitals filed for this visit. There were no vitals filed for this visit. There is no height or weight on file to calculate BMI. Ideal Body Weight:    GEN: no acute distress. HEENT: Atraumatic, Normocephalic.  Ears and Nose: No external deformity. CV: RRR, No M/G/R. No JVD. No thrill. No extra heart sounds. PULM: CTA B, no wheezes, crackles, rhonchi. No retractions. No resp. distress. No accessory muscle use. ABD: S, NT, ND, +BS. No rebound. No HSM. EXTR: No c/c/e PSYCH: Normally interactive. Conversant.    Assessment and Plan: *** This visit occurred during the SARS-CoV-2 public health emergency.  Safety protocols were in place, including screening questions prior to the visit, additional  usage of staff PPE, and extensive cleaning of exam room while observing appropriate contact time as indicated for disinfecting solutions.    Signed Abbe Amsterdam, MD

## 2020-03-27 ENCOUNTER — Ambulatory Visit: Payer: BC Managed Care – PPO | Admitting: Family Medicine

## 2020-03-30 NOTE — Progress Notes (Addendum)
[facility] [address] [place], [place] [id number] [phone] [phone]  Date:  04/01/2020   Name:  Emily Pham   DOB:  09/10/1980   MRN:  [id number]  PCP:  Pearline Cablesopland, Creedence Kunesh C, MD    Chief Complaint: No chief complaint on file.   History of Present Illness:  Emily Pham is a 40 y.o. very pleasant female patient who presents with the following:  Pt here today with RUQ pain Last seen by myself in March of 2021 also for RUQ pain - negative right upper quadrant US at that time   She does have omeprazole but was not always taking it. Her symptoms seem to return over the last few weeks, she went back on daily omeprazole 40 for 2 weeks Before she went back on omeprazole the pain was present from the epigastric area across her right side The pain is now localized to her RUQ  -omeprazole does not be helping, but has not resolved her symptoms It will come and go It does seem to be worse if she stops taking her omeprazole Not necessarily worse after she eats No vomiting No fever No jaundice  No chance of pregnancy per her report  Flu vaccine- done  covid vaccine- done including her booster  Pap- per her GYN   She ?did see GI years ag in her 4720s,  She had an US but no UGI  She notes that her mother did have a cholecystectomy for gallstones   Patient Active Problem List   Diagnosis Date Noted  . Obesity 05/29/2013  . Allergic rhinitis 03/08/2013  . Routine general medical examination at a health care facility 09/08/2012  . HTN (hypertension) 02/11/2012  . Breast pain 02/26/2011  . Epigastric pain 09/15/2010  . DEPRESSIVE DISORDER 04/11/2010  . GERD 04/11/2010    Past Medical History:  Diagnosis Date  . History of chicken pox   . Hypertension   . Vitamin D deficiency     Past Surgical History:  Procedure Laterality Date  . NO PAST SURGERIES      Social History   Tobacco Use  . Smoking status: Never Smoker   . Smokeless tobacco: Never Used  Substance Use Topics  . Alcohol use: No  . Drug use: No    Family History  Problem Relation Age of Onset  . Cancer Mother        Breast cancer dx age 39  . Arthritis Mother   . Diabetes Other        Grandparents  . Stroke Other        Grandparents  . Arthritis Father   . Diabetes Maternal Grandmother   . Hypertension Maternal Grandmother     Allergies  Allergen Reactions  . Codeine     Medication list has been reviewed and updated.  Current Outpatient Medications on File Prior to Visit  Medication Sig Dispense Refill  . norgestimate-ethinyl estradiol (MILI) 0.25-35 MG-MCG tablet Take 1 tablet by mouth daily.    Marland Kitchen. omeprazole (PRILOSEC) 40 MG capsule Take 1 capsule (40 mg total) by mouth daily. Reported on 07/22/2015 30 capsule 0   Current Facility-Administered Medications on File Prior to Visit  Medication Dose Route Frequency Provider Last Rate Last Admin  . azithromycin (ZITHROMAX) tablet 1,000 mg  1,000 mg Oral Daily Sharlene DoryWendling, Nicholas Paul, DO   1,000 mg at 01/11/17 1633    Review of Systems:  As per HPI- otherwise  negative.   Physical Examination: Vitals:   04/01/20 1559 04/01/20 1620  BP: (!) 155/99 125/80  Pulse: 90   Resp: 16   Temp: 99 F (37.2 C)   SpO2: 100%    Vitals:   04/01/20 1559  Weight: 195 lb (88.5 kg)  Height: 5' 3.5" (1.613 m)   Body mass index is 34 kg/m. Ideal Body Weight: Weight in (lb) to have BMI = 25: 143.1  GEN: no acute distress. Obese, otherwise well-appearing HEENT: Atraumatic, Normocephalic.  Ears and Nose: No external deformity. CV: RRR, No M/G/R. No JVD. No thrill. No extra heart sounds. PULM: CTA B, no wheezes, crackles, rhonchi. No retractions. No resp. distress. No accessory muscle use. ABD: S,  ND, +BS. No rebound. No HSM. She notes minimal epigastric and right upper quadrant tenderness, no guarding, negative Murphy sign EXTR: No c/c/e PSYCH: Normally interactive. Conversant.     Assessment and Plan: RUQ pain - Plan: Ambulatory referral to Gastroenterology, US Abdomen Limited RUQ (LIVER/GB), Comprehensive metabolic panel, Amylase, Lipase, CBC  Epigastric pain - Plan: Ambulatory referral to Gastroenterology, US Abdomen Limited RUQ (LIVER/GB), Comprehensive metabolic panel, Amylase, Lipase, CBC  Screening for diabetes mellitus - Plan: Hemoglobin A1c  Screening, lipid - Plan: Lipid panel  Fatigue, unspecified type - Plan: TSH, VITAMIN D 25 Hydroxy (Vit-D Deficiency, Fractures)  Patient here today with recurrent right upper quadrant pain. We looked at this issue for her about 10 months ago, ultrasound at that time was negative. However, I explained that ultrasound is not definitive for ruling out gallbladder disease. We will plan to repeat ultrasound, obtain lab work as above, refer to GI for further evaluation. She may need an ERCP versus upper GI For the time being continue omeprazole, she has a course of Carafate at home that she can use She is asked to seek care right away if getting worse or symptoms are changing She is scheduled to see me soon for a physical but will need to delay this appointment. We will go ahead and do routine labs, screen for diabetes and hyperlipidemia. Check TSH and vitamin D for routine fatigue symptoms  This visit occurred during the SARS-CoV-2 public health emergency.  Safety protocols were in place, including screening questions prior to the visit, additional usage of staff PPE, and extensive cleaning of exam room while observing appropriate contact time as indicated for disinfecting solutions.    Signed Lamar Blinks, MD  Received her labs 1/4- message to pt  Results for orders placed or performed in visit on 04/01/20  Comprehensive metabolic panel  Result Value Ref Range   Sodium 140 135 - 145 mEq/L   Potassium 3.8 3.5 - 5.1 mEq/L   Chloride 106 96 - 112 mEq/L   CO2 28 19 - 32 mEq/L   Glucose, Bld 91 70 - 99 mg/dL   BUN  8 6 - 23 mg/dL   Creatinine, Ser 0.79 0.40 - 1.20 mg/dL   Total Bilirubin 0.3 0.2 - 1.2 mg/dL   Alkaline Phosphatase 47 39 - 117 U/L   AST 11 0 - 37 U/L   ALT 7 0 - 35 U/L   Total Protein 6.3 6.0 - 8.3 g/dL   Albumin 4.0 3.5 - 5.2 g/dL   GFR 94.67 >60.00 mL/min   Calcium 9.1 8.4 - 10.5 mg/dL  Amylase  Result Value Ref Range   Amylase 65 27 - 131 U/L  Lipase  Result Value Ref Range   Lipase 29.0 11.0 - 59.0 U/L  CBC  Result Value Ref Range   WBC 6.1 4.0 - 10.5 K/uL   RBC 4.29 3.87 - 5.11 Mil/uL   Platelets 319.0 150.0 - 400.0 K/uL   Hemoglobin 12.7 12.0 - 15.0 g/dL   HCT 22.9 79.8 - 92.1 %   MCV 89.6 78.0 - 100.0 fl   MCHC 33.1 30.0 - 36.0 g/dL   RDW 19.4 17.4 - 08.1 %  Hemoglobin A1c  Result Value Ref Range   Hgb A1c MFr Bld 5.3 4.6 - 6.5 %  Lipid panel  Result Value Ref Range   Cholesterol 222 (H) 0 - 200 mg/dL   Triglycerides 44.8 0.0 - 149.0 mg/dL   HDL 185.63 >14.97 mg/dL   VLDL 02.6 0.0 - 37.8 mg/dL   LDL Cholesterol 588 (H) 0 - 99 mg/dL   Total CHOL/HDL Ratio 2    NonHDL 115.88   TSH  Result Value Ref Range   TSH 0.83 0.35 - 4.50 uIU/mL  VITAMIN D 25 Hydroxy (Vit-D Deficiency, Fractures)  Result Value Ref Range   VITD 17.27 (L) 30.00 - 100.00 ng/mL

## 2020-03-30 NOTE — Patient Instructions (Addendum)
Good to see you again today!  Please continue the omeprazole and go ahead and use the carafate We will get labs and re-ultrasound your liver and gallbladder.  Will refer you to see gastro as well!    Please let me know if you are getting worse or have any other change in your symptoms

## 2020-04-01 ENCOUNTER — Other Ambulatory Visit: Payer: Self-pay

## 2020-04-01 ENCOUNTER — Ambulatory Visit (INDEPENDENT_AMBULATORY_CARE_PROVIDER_SITE_OTHER): Payer: BC Managed Care – PPO | Admitting: Family Medicine

## 2020-04-01 VITALS — BP 125/80 | HR 90 | Temp 99.0°F | Resp 16 | Ht 63.5 in | Wt 195.0 lb

## 2020-04-01 DIAGNOSIS — Z13220 Encounter for screening for lipoid disorders: Secondary | ICD-10-CM

## 2020-04-01 DIAGNOSIS — R5383 Other fatigue: Secondary | ICD-10-CM

## 2020-04-01 DIAGNOSIS — R1011 Right upper quadrant pain: Secondary | ICD-10-CM

## 2020-04-01 DIAGNOSIS — Z131 Encounter for screening for diabetes mellitus: Secondary | ICD-10-CM

## 2020-04-01 DIAGNOSIS — R1013 Epigastric pain: Secondary | ICD-10-CM

## 2020-04-01 DIAGNOSIS — E559 Vitamin D deficiency, unspecified: Secondary | ICD-10-CM

## 2020-04-02 ENCOUNTER — Encounter: Payer: Self-pay | Admitting: Family Medicine

## 2020-04-02 LAB — LIPASE: Lipase: 29 U/L (ref 11.0–59.0)

## 2020-04-02 LAB — CBC
HCT: 38.5 % (ref 36.0–46.0)
Hemoglobin: 12.7 g/dL (ref 12.0–15.0)
MCHC: 33.1 g/dL (ref 30.0–36.0)
MCV: 89.6 fl (ref 78.0–100.0)
Platelets: 319 10*3/uL (ref 150.0–400.0)
RBC: 4.29 Mil/uL (ref 3.87–5.11)
RDW: 14.7 % (ref 11.5–15.5)
WBC: 6.1 10*3/uL (ref 4.0–10.5)

## 2020-04-02 LAB — LIPID PANEL
Cholesterol: 222 mg/dL — ABNORMAL HIGH (ref 0–200)
HDL: 106.4 mg/dL (ref >39.00)
LDL Cholesterol: 104 mg/dL — ABNORMAL HIGH (ref 0–99)
NonHDL: 115.88
Total CHOL/HDL Ratio: 2
Triglycerides: 58 mg/dL (ref 0.0–149.0)
VLDL: 11.6 mg/dL (ref 0.0–40.0)

## 2020-04-02 LAB — COMPREHENSIVE METABOLIC PANEL
ALT: 7 U/L (ref 0–35)
AST: 11 U/L (ref 0–37)
Albumin: 4 g/dL (ref 3.5–5.2)
Alkaline Phosphatase: 47 U/L (ref 39–117)
BUN: 8 mg/dL (ref 6–23)
CO2: 28 mEq/L (ref 19–32)
Calcium: 9.1 mg/dL (ref 8.4–10.5)
Chloride: 106 mEq/L (ref 96–112)
Creatinine, Ser: 0.79 mg/dL (ref 0.40–1.20)
GFR: 94.67 mL/min (ref >60.00)
Glucose, Bld: 91 mg/dL (ref 70–99)
Potassium: 3.8 mEq/L (ref 3.5–5.1)
Sodium: 140 mEq/L (ref 135–145)
Total Bilirubin: 0.3 mg/dL (ref 0.2–1.2)
Total Protein: 6.3 g/dL (ref 6.0–8.3)

## 2020-04-02 LAB — AMYLASE: Amylase: 65 U/L (ref 27–131)

## 2020-04-02 LAB — VITAMIN D 25 HYDROXY (VIT D DEFICIENCY, FRACTURES): VITD: 17.27 ng/mL — ABNORMAL LOW (ref 30.00–100.00)

## 2020-04-02 LAB — HEMOGLOBIN A1C: Hgb A1c MFr Bld: 5.3 % (ref 4.6–6.5)

## 2020-04-02 LAB — TSH: TSH: 0.83 u[IU]/mL (ref 0.35–4.50)

## 2020-04-02 MED ORDER — VITAMIN D3 1.25 MG (50000 UT) PO CAPS: ORAL_CAPSULE | ORAL | 0 refills | Status: DC

## 2020-04-02 NOTE — Addendum Note (Signed)
Addended by: Abbe Amsterdam C on: 04/02/2020 01:00 PM   Modules accepted: Orders

## 2020-04-08 ENCOUNTER — Encounter: Payer: Self-pay | Admitting: Family Medicine

## 2020-04-08 ENCOUNTER — Other Ambulatory Visit: Payer: Self-pay

## 2020-04-08 ENCOUNTER — Ambulatory Visit (HOSPITAL_BASED_OUTPATIENT_CLINIC_OR_DEPARTMENT_OTHER)
Admission: RE | Admit: 2020-04-08 | Discharge: 2020-04-08 | Disposition: A | Discharge status: Home / Self Care | Payer: BC Managed Care – PPO | Source: Ambulatory Visit | Attending: Family Medicine | Admitting: Family Medicine

## 2020-04-08 DIAGNOSIS — R1011 Right upper quadrant pain: Secondary | ICD-10-CM

## 2020-04-08 DIAGNOSIS — R1013 Epigastric pain: Secondary | ICD-10-CM | POA: Diagnosis present

## 2020-04-15 ENCOUNTER — Encounter: Payer: BC Managed Care – PPO | Admitting: Family Medicine

## 2020-06-03 NOTE — Progress Notes (Deleted)
[place] at [facility] [address] [place], [place] [address] [phone]  Date:  06/05/2020   Name:  Emily Pham   DOB:  [birth year]   MRN:  [id number]  PCP:  Darreld Mclean, MD    Chief Complaint: No chief complaint on file.   History of Present Illness:  Emily Pham is a 40 y.o. very pleasant female patient who presents with the following:  Patient here today with concern of knee pain Last seen by myself in January when she was dealing with some right upper quadrant pain She was referred to gastroenterology at that time but I do not think she was ever seen  COVID vaccine Flu shot HIV and hep C screening could be updated Pap screening Patient Active Problem List   Diagnosis Date Noted  . Obesity 05/29/2013  . Allergic rhinitis 03/08/2013  . Routine general medical examination at a health care facility 09/08/2012  . HTN (hypertension) 02/11/2012  . Breast pain 02/26/2011  . Epigastric pain 09/15/2010  . DEPRESSIVE DISORDER 04/11/2010  . GERD 04/11/2010    Past Medical History:  Diagnosis Date  . History of chicken pox   . Hypertension   . Vitamin D deficiency     Past Surgical History:  Procedure Laterality Date  . NO PAST SURGERIES      Social History   Tobacco Use  . Smoking status: Never Smoker  . Smokeless tobacco: Never Used  Substance Use Topics  . Alcohol use: No  . Drug use: No    Family History  Problem Relation Age of Onset  . Cancer Mother        Breast cancer dx age 83  . Arthritis Mother   . Diabetes Other        Grandparents  . Stroke Other        Grandparents  . Arthritis Father   . Diabetes Maternal Grandmother   . Hypertension Maternal Grandmother     Allergies  Allergen Reactions  . Codeine     Medication list has been reviewed and updated.  Current Outpatient Medications on File Prior to Visit  Medication Sig Dispense Refill  . Cholecalciferol (VITAMIN D3)  1.25 MG (50000 UT) CAPS Take 1 weekly for 12 weeks 12 capsule 0  . norgestimate-ethinyl estradiol (MILI) 0.25-35 MG-MCG tablet Take 1 tablet by mouth daily.    Marland Kitchen omeprazole (PRILOSEC) 40 MG capsule Take 1 capsule (40 mg total) by mouth daily. Reported on 07/22/2015 30 capsule 0   Current Facility-Administered Medications on File Prior to Visit  Medication Dose Route Frequency Provider Last Rate Last Admin  . azithromycin (ZITHROMAX) tablet 1,000 mg  1,000 mg Oral Daily Shelda Pal, DO   1,000 mg at 01/11/17 1633    Review of Systems:  As per HPI- otherwise negative.   Physical Examination: There were no vitals filed for this visit. There were no vitals filed for this visit. There is no height or weight on file to calculate BMI. Ideal Body Weight:    GEN: no acute distress. HEENT: Atraumatic, Normocephalic.  Ears and Nose: No external deformity. CV: RRR, No M/G/R. No JVD. No thrill. No extra heart sounds. PULM: CTA B, no wheezes, crackles, rhonchi. No retractions. No resp. distress. No accessory muscle use. ABD: S, NT, ND, +BS. No rebound. No HSM. EXTR: No c/c/e PSYCH: Normally interactive. Conversant.    Assessment and Plan: *** This visit occurred during the  SARS-CoV-2 public health emergency.  Safety protocols were in place, including screening questions prior to the visit, additional usage of staff PPE, and extensive cleaning of exam room while observing appropriate contact time as indicated for disinfecting solutions.    Signed Lamar Blinks, MD

## 2020-06-04 ENCOUNTER — Other Ambulatory Visit: Payer: Self-pay

## 2020-06-04 ENCOUNTER — Ambulatory Visit: Payer: Self-pay

## 2020-06-04 ENCOUNTER — Other Ambulatory Visit: Payer: Self-pay | Admitting: Occupational Medicine

## 2020-06-04 DIAGNOSIS — M25562 Pain in left knee: Secondary | ICD-10-CM

## 2020-06-05 ENCOUNTER — Ambulatory Visit: Payer: BC Managed Care – PPO | Admitting: Family Medicine

## 2020-11-06 ENCOUNTER — Encounter: Payer: Self-pay | Admitting: Family Medicine

## 2020-11-06 DIAGNOSIS — R1011 Right upper quadrant pain: Secondary | ICD-10-CM

## 2021-04-17 ENCOUNTER — Emergency Department (HOSPITAL_COMMUNITY)
Admission: EM | Admit: 2021-04-17 | Discharge: 2021-04-17 | Disposition: A | Discharge status: Home / Self Care | Payer: BC Managed Care – PPO | Attending: Emergency Medicine | Admitting: Emergency Medicine

## 2021-04-17 ENCOUNTER — Encounter (HOSPITAL_COMMUNITY): Payer: Self-pay

## 2021-04-17 ENCOUNTER — Emergency Department (HOSPITAL_COMMUNITY): Payer: BC Managed Care – PPO

## 2021-04-17 DIAGNOSIS — O9A211 Injury, poisoning and certain other consequences of external causes complicating pregnancy, first trimester: Secondary | ICD-10-CM | POA: Insufficient documentation

## 2021-04-17 DIAGNOSIS — Y92410 Unspecified street and highway as the place of occurrence of the external cause: Secondary | ICD-10-CM | POA: Insufficient documentation

## 2021-04-17 DIAGNOSIS — Z3A01 Less than 8 weeks gestation of pregnancy: Secondary | ICD-10-CM | POA: Diagnosis not present

## 2021-04-17 DIAGNOSIS — S060X1A Concussion with loss of consciousness of 30 minutes or less, initial encounter: Secondary | ICD-10-CM

## 2021-04-17 DIAGNOSIS — O26891 Other specified pregnancy related conditions, first trimester: Secondary | ICD-10-CM | POA: Insufficient documentation

## 2021-04-17 DIAGNOSIS — M25512 Pain in left shoulder: Secondary | ICD-10-CM | POA: Diagnosis not present

## 2021-04-17 DIAGNOSIS — H5789 Other specified disorders of eye and adnexa: Secondary | ICD-10-CM | POA: Diagnosis not present

## 2021-04-17 DIAGNOSIS — I10 Essential (primary) hypertension: Secondary | ICD-10-CM | POA: Insufficient documentation

## 2021-04-17 DIAGNOSIS — R519 Headache, unspecified: Secondary | ICD-10-CM | POA: Diagnosis not present

## 2021-04-17 LAB — CBC WITH DIFFERENTIAL/PLATELET
Abs Immature Granulocytes: 0.03 10*3/uL (ref 0.00–0.07)
Basophils Absolute: 0 10*3/uL (ref 0.0–0.1)
Basophils Relative: 0 %
Eosinophils Absolute: 0 10*3/uL (ref 0.0–0.5)
Eosinophils Relative: 0 %
HCT: 40.9 % (ref 36.0–46.0)
Hemoglobin: 13.6 g/dL (ref 12.0–15.0)
Immature Granulocytes: 0 %
Lymphocytes Relative: 33 %
Lymphs Abs: 2.4 10*3/uL (ref 0.7–4.0)
MCH: 29.6 pg (ref 26.0–34.0)
MCHC: 33.3 g/dL (ref 30.0–36.0)
MCV: 88.9 fL (ref 80.0–100.0)
Monocytes Absolute: 0.5 10*3/uL (ref 0.1–1.0)
Monocytes Relative: 7 %
Neutro Abs: 4.2 10*3/uL (ref 1.7–7.7)
Neutrophils Relative %: 60 %
Platelets: 343 10*3/uL (ref 150–400)
RBC: 4.6 MIL/uL (ref 3.87–5.11)
RDW: 14.2 % (ref 11.5–15.5)
WBC: 7.1 10*3/uL (ref 4.0–10.5)
nRBC: 0 % (ref 0.0–0.2)

## 2021-04-17 LAB — BASIC METABOLIC PANEL
Anion gap: 10 (ref 5–15)
BUN: 7 mg/dL (ref 6–20)
CO2: 21 mmol/L — ABNORMAL LOW (ref 22–32)
Calcium: 9.2 mg/dL (ref 8.9–10.3)
Chloride: 105 mmol/L (ref 98–111)
Creatinine, Ser: 0.82 mg/dL (ref 0.44–1.00)
GFR, Estimated: >60 mL/min (ref >60)
Glucose, Bld: 103 mg/dL — ABNORMAL HIGH (ref 70–99)
Potassium: 3.3 mmol/L — ABNORMAL LOW (ref 3.5–5.1)
Sodium: 136 mmol/L (ref 135–145)

## 2021-04-17 LAB — HCG, QUANTITATIVE, PREGNANCY: hCG, Beta Chain, Quant, S: 9400 m[IU]/mL — ABNORMAL HIGH (ref <5)

## 2021-04-17 LAB — I-STAT BETA HCG BLOOD, ED (MC, WL, AP ONLY): I-stat hCG, quantitative: >2000 m[IU]/mL — ABNORMAL HIGH (ref <5)

## 2021-04-17 LAB — ABO/RH: ABO/RH(D): O POS

## 2021-04-17 MED ORDER — SODIUM CHLORIDE 0.9 % IV SOLN
12.5000 mg | Freq: Once | INTRAVENOUS | Status: AC
  Administered 2021-04-17: 12.5 mg via INTRAVENOUS
  Filled 2021-04-17: qty 0.5
  Filled 2021-04-17: qty 12.5

## 2021-04-17 MED ORDER — SODIUM CHLORIDE 0.9 % IV BOLUS
1000.0000 mL | Freq: Once | INTRAVENOUS | Status: AC
  Administered 2021-04-17: 1000 mL via INTRAVENOUS

## 2021-04-17 MED ORDER — ONDANSETRON HCL 4 MG/2ML IJ SOLN
4.0000 mg | Freq: Once | INTRAMUSCULAR | Status: AC
  Administered 2021-04-17: 4 mg via INTRAVENOUS
  Filled 2021-04-17: qty 2

## 2021-04-17 MED ORDER — TETRACAINE HCL 0.5 % OP SOLN
2.0000 [drp] | Freq: Once | OPHTHALMIC | Status: AC
Start: 2021-04-17 — End: 2021-04-17
  Administered 2021-04-17: 2 [drp] via OPHTHALMIC
  Filled 2021-04-17: qty 4

## 2021-04-17 MED ORDER — PRENATAL VITAMINS 28-0.8 MG PO TABS: 1.0000 | ORAL_TABLET | Freq: Every day | ORAL | 0 refills | Status: DC

## 2021-04-17 MED ORDER — FLUORESCEIN SODIUM 1 MG OP STRP
1.0000 | ORAL_STRIP | Freq: Once | OPHTHALMIC | Status: AC
Start: 2021-04-17 — End: 2021-04-17
  Administered 2021-04-17: 1 via OPHTHALMIC
  Filled 2021-04-17: qty 1

## 2021-04-17 MED ORDER — ONDANSETRON 4 MG PO TBDP: 4.0000 mg | ORAL_TABLET | Freq: Three times a day (TID) | ORAL | 0 refills | Status: DC | PRN

## 2021-04-17 MED ORDER — ALUM & MAG HYDROXIDE-SIMETH 200-200-20 MG/5ML PO SUSP: 15.0000 mL | Freq: Once | ORAL | Status: DC

## 2021-04-17 MED ORDER — CALCIUM CARBONATE ANTACID 500 MG PO CHEW
1.0000 | CHEWABLE_TABLET | Freq: Once | ORAL | Status: AC
  Administered 2021-04-17: 200 mg via ORAL
  Filled 2021-04-17: qty 1

## 2021-04-17 MED ORDER — ACETAMINOPHEN 500 MG PO TABS
1000.0000 mg | ORAL_TABLET | Freq: Once | ORAL | Status: AC
  Administered 2021-04-17: 1000 mg via ORAL
  Filled 2021-04-17: qty 2
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
## 2021-04-17 NOTE — ED Notes (Signed)
Pt ambulated to restroom without complication
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
## 2021-04-18 LAB — OB RESULTS CONSOLE ABO/RH: RH Type: POSITIVE

## 2021-04-18 LAB — OB RESULTS CONSOLE ANTIBODY SCREEN: Antibody Screen: NEGATIVE

## 2021-04-21 ENCOUNTER — Ambulatory Visit (INDEPENDENT_AMBULATORY_CARE_PROVIDER_SITE_OTHER): Payer: BC Managed Care – PPO | Admitting: Family Medicine

## 2021-04-21 ENCOUNTER — Encounter: Payer: Self-pay | Admitting: Family Medicine

## 2021-04-21 VITALS — BP 138/93 | HR 61 | Temp 98.3°F | Resp 12 | Ht 63.5 in | Wt 179.4 lb

## 2021-04-21 DIAGNOSIS — S060X1A Concussion with loss of consciousness of 30 minutes or less, initial encounter: Secondary | ICD-10-CM | POA: Diagnosis not present

## 2021-04-21 NOTE — Patient Instructions (Addendum)
Continue brain rest for the next 2-3 days, then you can slowly try to start adding simple activities back in. Stop and slow down if anything starts causing your symptoms to worsen. If not improving by the end of the week, follow-up.   Schedule an appointment with [facility] (Sports Medicine); Concussion Hotline: [phone].
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
## 2021-05-22 LAB — HEPATITIS C ANTIBODY: HCV Ab: NEGATIVE

## 2021-05-22 LAB — OB RESULTS CONSOLE HIV ANTIBODY (ROUTINE TESTING): HIV: NONREACTIVE

## 2021-05-22 LAB — OB RESULTS CONSOLE RPR: RPR: NONREACTIVE

## 2021-05-22 LAB — OB RESULTS CONSOLE GC/CHLAMYDIA
Chlamydia: NEGATIVE
Neisseria Gonorrhea: NEGATIVE

## 2021-05-22 LAB — OB RESULTS CONSOLE HEPATITIS B SURFACE ANTIGEN: Hepatitis B Surface Ag: NEGATIVE

## 2021-05-22 LAB — OB RESULTS CONSOLE RUBELLA ANTIBODY, IGM: Rubella: IMMUNE

## 2021-08-02 ENCOUNTER — Inpatient Hospital Stay (HOSPITAL_COMMUNITY)
Admission: AD | Admit: 2021-08-02 | Discharge: 2021-08-02 | Disposition: A | Discharge status: Home / Self Care | Payer: BC Managed Care – PPO | Attending: Obstetrics and Gynecology | Admitting: Obstetrics and Gynecology

## 2021-08-02 ENCOUNTER — Other Ambulatory Visit: Payer: Self-pay

## 2021-08-02 ENCOUNTER — Encounter (HOSPITAL_COMMUNITY): Payer: Self-pay | Admitting: Obstetrics and Gynecology

## 2021-08-02 DIAGNOSIS — O10912 Unspecified pre-existing hypertension complicating pregnancy, second trimester: Secondary | ICD-10-CM | POA: Diagnosis present

## 2021-08-02 DIAGNOSIS — O219 Vomiting of pregnancy, unspecified: Secondary | ICD-10-CM | POA: Insufficient documentation

## 2021-08-02 DIAGNOSIS — R12 Heartburn: Secondary | ICD-10-CM | POA: Diagnosis not present

## 2021-08-02 DIAGNOSIS — Z3A20 20 weeks gestation of pregnancy: Secondary | ICD-10-CM | POA: Insufficient documentation

## 2021-08-02 LAB — URINALYSIS, ROUTINE W REFLEX MICROSCOPIC
Bilirubin Urine: NEGATIVE
Glucose, UA: NEGATIVE mg/dL
Hgb urine dipstick: NEGATIVE
Ketones, ur: 20 mg/dL — AB
Leukocytes,Ua: NEGATIVE
Nitrite: NEGATIVE
Protein, ur: NEGATIVE mg/dL
Specific Gravity, Urine: 1.029 (ref 1.005–1.030)
pH: 5 (ref 5.0–8.0)

## 2021-08-02 MED ORDER — LACTATED RINGERS IV BOLUS
1000.0000 mL | Freq: Once | INTRAVENOUS | Status: AC
Start: 2021-08-02 — End: 2021-08-02
  Administered 2021-08-02: 1000 mL via INTRAVENOUS

## 2021-08-02 MED ORDER — ONDANSETRON HCL 4 MG/2ML IJ SOLN
4.0000 mg | Freq: Once | INTRAMUSCULAR | Status: AC
Start: 2021-08-02 — End: 2021-08-02
  Administered 2021-08-02: 4 mg via INTRAVENOUS
  Filled 2021-08-02: qty 2

## 2021-08-02 MED ORDER — ONDANSETRON 4 MG PO TBDP: 4.0000 mg | ORAL_TABLET | Freq: Three times a day (TID) | ORAL | 2 refills | Status: DC | PRN

## 2021-08-02 MED ORDER — FAMOTIDINE IN NACL 20-0.9 MG/50ML-% IV SOLN
20.0000 mg | Freq: Once | INTRAVENOUS | Status: AC
Start: 2021-08-02 — End: 2021-08-02
  Administered 2021-08-02: 20 mg via INTRAVENOUS
  Filled 2021-08-02: qty 50

## 2021-08-02 MED ORDER — PROMETHAZINE HCL 25 MG PO TABS: 25.0000 mg | ORAL_TABLET | Freq: Four times a day (QID) | ORAL | 0 refills | Status: DC | PRN

## 2021-08-02 NOTE — Discharge Instructions (Signed)

## 2021-08-02 NOTE — MAU Provider Note (Signed)
?History  ?  ? ?CSN: [id number] ? ?Arrival date and time: 08/02/21 1440 ? ? Event Date/Time  ? First Provider Initiated Contact with Patient 08/02/21 1543   ?  ? ?Chief Complaint  ?Patient presents with  ? Nausea  ? ?HPI ?Emily Pham is a 41 y.o. G1P0 at 35w3dwho presents for evaluation of nausea and vomiting. She reports for the last week, her nausea and vomiting has been worse. She felt like this was normal morning sickness but it has persisted longer than she anticipated. She also thinks her heartburn is getting worse. She denies any nausea at this time but feels like she is weak and dehydrated. ? ?She denies any abdominal pain, vaginal bleeding or discharge. She reports feeling fetal movement.  ? ?OB History   ? ? Gravida  ?1  ? Para  ?   ? Term  ?   ? Preterm  ?   ? AB  ?   ? Living  ?   ?  ? ? SAB  ?   ? IAB  ?   ? Ectopic  ?   ? Multiple  ?   ? Live Births  ?   ?   ?  ?  ? ? ?Past Medical History:  ?Diagnosis Date  ? History of chicken pox   ? Hypertension   ? Vitamin D deficiency   ? ? ?Past Surgical History:  ?Procedure Laterality Date  ? NO PAST SURGERIES    ? ? ?Family History  ?Problem Relation Age of Onset  ? Cancer Mother   ?     Breast cancer dx age 40 ? Arthritis Mother   ? Diabetes Other   ?     Grandparents  ? Stroke Other   ?     Grandparents  ? Arthritis Father   ? Diabetes Maternal Grandmother   ? Hypertension Maternal Grandmother   ? ? ?Social History  ? ?Tobacco Use  ? Smoking status: Never  ? Smokeless tobacco: Never  ?Substance Use Topics  ? Alcohol use: No  ? Drug use: No  ? ? ?Allergies:  ?Allergies  ?Allergen Reactions  ? Codeine   ?  Family allergy  ? ? ?Facility-Administered Medications Prior to Admission  ?Medication Dose Route Frequency Provider Last Rate Last Admin  ? azithromycin (ZITHROMAX) tablet 1,000 mg  1,000 mg Oral Daily WShelda Pal DO   1,000 mg at 01/11/17 1633  ? ?Medications Prior to Admission  ?Medication Sig Dispense Refill Last Dose  ? omeprazole  (PRILOSEC) 20 MG capsule Take 20 mg by mouth daily as needed (heartburn/indigestion).   08/01/2021  ? Prenatal Vit-Fe Fumarate-FA (PRENATAL VITAMINS) 28-0.8 MG TABS Take 1 tablet by mouth daily. 30 tablet 0 Past Week  ? ondansetron (ZOFRAN-ODT) 4 MG disintegrating tablet Take 1 tablet (4 mg total) by mouth every 8 (eight) hours as needed for nausea or vomiting. (Patient not taking: Reported on 04/21/2021) 20 tablet 0   ? ? ?Review of Systems  ?Constitutional: Negative.  Negative for fatigue and fever.  ?HENT: Negative.    ?Respiratory: Negative.  Negative for shortness of breath.   ?Cardiovascular: Negative.  Negative for chest pain.  ?Gastrointestinal:  Positive for nausea and vomiting. Negative for abdominal pain, constipation and diarrhea.  ?Genitourinary: Negative.  Negative for dysuria, vaginal bleeding and vaginal discharge.  ?Neurological: Negative.  Negative for dizziness and headaches.  ?Physical Exam  ? ?Blood pressure 131/86, pulse 80, temperature 97.8 ?F (36.6 ?C), temperature  source Oral, resp. rate 18, height '5\' 3"'$  (1.6 m), weight 84 kg, last menstrual period 03/12/2021. ? ?Physical Exam ?Vitals and nursing note reviewed.  ?Constitutional:   ?   General: She is not in acute distress. ?   Appearance: She is well-developed.  ?HENT:  ?   Head: Normocephalic.  ?Eyes:  ?   Pupils: Pupils are equal, round, and reactive to light.  ?Cardiovascular:  ?   Rate and Rhythm: Normal rate and regular rhythm.  ?   Heart sounds: Normal heart sounds.  ?Pulmonary:  ?   Effort: Pulmonary effort is normal. No respiratory distress.  ?   Breath sounds: Normal breath sounds.  ?Abdominal:  ?   General: Bowel sounds are normal. There is no distension.  ?   Palpations: Abdomen is soft.  ?   Tenderness: There is no abdominal tenderness.  ?Skin: ?   General: Skin is warm and dry.  ?Neurological:  ?   Mental Status: She is alert and oriented to person, place, and time.  ?Psychiatric:     ?   Mood and Affect: Mood normal.     ?    Behavior: Behavior normal.     ?   Thought Content: Thought content normal.     ?   Judgment: Judgment normal.  ? ?FHT: 148 bpm ? ?MAU Course  ?Procedures ?Results for orders placed or performed during the hospital encounter of 08/02/21 (from the past 24 hour(s))  ?Urinalysis, Routine w reflex microscopic     Status: Abnormal  ? Collection Time: 08/02/21  3:47 PM  ?Result Value Ref Range  ? Color, Urine AMBER (A) YELLOW  ? APPearance HAZY (A) CLEAR  ? Specific Gravity, Urine 1.029 1.005 - 1.030  ? pH 5.0 5.0 - 8.0  ? Glucose, UA NEGATIVE NEGATIVE mg/dL  ? Hgb urine dipstick NEGATIVE NEGATIVE  ? Bilirubin Urine NEGATIVE NEGATIVE  ? Ketones, ur 20 (A) NEGATIVE mg/dL  ? Protein, ur NEGATIVE NEGATIVE mg/dL  ? Nitrite NEGATIVE NEGATIVE  ? Leukocytes,Ua NEGATIVE NEGATIVE  ? RBC / HPF 0-5 0 - 5 RBC/hpf  ? WBC, UA 0-5 0 - 5 WBC/hpf  ? Bacteria, UA RARE (A) NONE SEEN  ? Squamous Epithelial / LPF 0-5 0 - 5  ? Mucus PRESENT   ? Hyaline Casts, UA PRESENT   ?  ?MDM ?UA ?LR bolus ?Pepcid ?Zofran ? ?Patient tolerated PO before discharge.  ? ?Assessment and Plan  ? ?1. Nausea and vomiting during pregnancy   ?2. [redacted] weeks gestation of pregnancy   ? ?-Discharge home in stable condition ?-Rx for zofran and phenergan sent to patient's pharmacy. Encouraged patient to increase heartburn medication to BID ?-Second trimester precautions discussed ?-Patient advised to follow-up with OB as scheduled for prenatal care ?-Patient may return to MAU as needed or if her condition were to change or worsen ? ? ?Wende Mott CNM ?08/02/2021, 3:43 PM  ?

## 2021-08-02 NOTE — MAU Note (Signed)
Patient reports nausea and vomiting since Monday.  At first she thought it was related to pregnancy, but since it's been going on all week she is unsure. Has vomited x3 in past 24 hours.  Not nauseated at this time.  Reports coughing with allergies that seem to make it worse.  Denies diarrhea. Denies pain, vaginal bleeding, or LOF.   ?

## 2021-11-14 LAB — OB RESULTS CONSOLE GBS: GBS: NEGATIVE

## 2021-11-26 ENCOUNTER — Telehealth (HOSPITAL_COMMUNITY): Payer: Self-pay | Admitting: *Deleted

## 2021-11-26 NOTE — Telephone Encounter (Signed)
Preadmission screen  

## 2021-11-27 ENCOUNTER — Encounter (HOSPITAL_COMMUNITY): Payer: Self-pay

## 2021-12-02 ENCOUNTER — Telehealth (HOSPITAL_COMMUNITY): Payer: Self-pay | Admitting: *Deleted

## 2021-12-02 NOTE — Telephone Encounter (Signed)
Preadmission screen  

## 2021-12-03 ENCOUNTER — Telehealth (HOSPITAL_COMMUNITY): Payer: Self-pay | Admitting: *Deleted

## 2021-12-03 NOTE — Telephone Encounter (Signed)
Preadmission screen  

## 2021-12-09 ENCOUNTER — Other Ambulatory Visit: Payer: Self-pay | Admitting: Obstetrics

## 2021-12-09 NOTE — H&P (Signed)
Emily Pham is a 41 y.o. G1P0 at 36w0dpresenting for IOL [facility] Pt notes rare contractions. Good fetal movement, No vaginal bleeding, not leaking fluid.  Pt admitted this am, cytotec given PV and pt starting to feel cramping. No LOF, no VB. Emesis x 1.   [facility], normal Panorama - fibroids. Largest 10x10 posterior with some degeneration in preg. Ant IM 5x5.  - GERD, on protonix -prior cyro, known cervical scar - prior dx of htn in 20s, no current htn   Prenatal Transfer Tool  Maternal Diabetes: No Genetic Screening: Normal Maternal Ultrasounds/Referrals: Normal Fetal Ultrasounds or other Referrals:  None Maternal Substance Abuse:  No Significant Maternal Medications:  None Significant Maternal Lab Results: Group B Strep negative     OB History     Gravida  1   Para      Term      Preterm      AB      Living         SAB      IAB      Ectopic      Multiple      Live Births             Past Medical History:  Diagnosis Date   History of chicken pox    Hypertension    Vitamin D deficiency    Past Surgical History:  Procedure Laterality Date   NO PAST SURGERIES     Family History: family history includes Arthritis in her father and mother; Cancer in her mother; Diabetes in her maternal grandmother and another family member; Hypertension in her maternal grandmother; Stroke in an other family member. Social History:  reports that she has never smoked. She has never used smokeless tobacco. She reports that she does not drink alcohol and does not use drugs.  Review of Systems - Negative except discomfort of preg, anxiety    Physical Exam:  Vitals:   12/10/21 1144 12/10/21 1153 12/10/21 1256 12/10/21 1300  BP: (!) 133/102  128/83   Pulse: 96  70   Resp:  '18 16 16  '$ Temp:  98.3 F (36.8 C)    TempSrc:  Oral    Weight:      Height:        Gen: well appearing, no distress  Back: no  CVAT Abd: gravid, NT, no RUQ pain LE: trace edema, equal bilaterally, non-tender Toco: irritibility FH: baseline 130s, accelerations present, no deceleratons, 10 beat variability  Cvx: FT/ 60%/ vtx -2. Pt consented for cervical foley. Manual placement of foley, inflated with 60cc saline. Tolerated well.   Prenatal labs: ABO, Rh: O/Positive/-- (01/20 0000) Antibody: Negative (01/20 0000) Rubella: Immune (02/23 0000) RPR: Nonreactive (02/23 0000)  HBsAg: Negative (02/23 0000)  HIV: Non-reactive (02/23 0000)  GBS:   neg 1 hr Glucola 97  Genetic screening nl Panorama Anatomy [place]   Assessment/Plan: 41y.o. G1P0 at 321w0d AMA. Plan IOL at 39 wks. Cervical ripening o/n, plan foley/ pit in am. - fibroids, on iron, active management third stage - h/o htn but no htn in preg, watch PP   KeAla Dach/02/2022, 12:47 PM   Pt not admitted til this morning. Cytotc x 1, foley placed now and 2nd cytotec being placed now.  - reactive fetal testing.   KeAla Dach/13/2023 4:05 PM

## 2021-12-10 ENCOUNTER — Inpatient Hospital Stay (HOSPITAL_COMMUNITY): Payer: BC Managed Care – PPO

## 2021-12-10 ENCOUNTER — Inpatient Hospital Stay (HOSPITAL_COMMUNITY)
Admission: AD | Admit: 2021-12-10 | Discharge: 2021-12-13 | DRG: 788 | Disposition: A | Discharge status: Home / Self Care | Payer: BC Managed Care – PPO | Attending: Obstetrics | Admitting: Obstetrics

## 2021-12-10 ENCOUNTER — Encounter (HOSPITAL_COMMUNITY): Payer: Self-pay | Admitting: Obstetrics

## 2021-12-10 ENCOUNTER — Other Ambulatory Visit: Payer: Self-pay

## 2021-12-10 DIAGNOSIS — K219 Gastro-esophageal reflux disease without esophagitis: Secondary | ICD-10-CM | POA: Diagnosis present

## 2021-12-10 DIAGNOSIS — Z3A39 39 weeks gestation of pregnancy: Secondary | ICD-10-CM | POA: Diagnosis not present

## 2021-12-10 DIAGNOSIS — Z98891 History of uterine scar from previous surgery: Secondary | ICD-10-CM

## 2021-12-10 DIAGNOSIS — O3410 Maternal care for benign tumor of corpus uteri, unspecified trimester: Secondary | ICD-10-CM | POA: Diagnosis present

## 2021-12-10 DIAGNOSIS — O9902 Anemia complicating childbirth: Secondary | ICD-10-CM | POA: Diagnosis present

## 2021-12-10 DIAGNOSIS — O9962 Diseases of the digestive system complicating childbirth: Secondary | ICD-10-CM | POA: Diagnosis present

## 2021-12-10 DIAGNOSIS — O09513 Supervision of elderly primigravida, third trimester: Secondary | ICD-10-CM | POA: Diagnosis not present

## 2021-12-10 DIAGNOSIS — O3413 Maternal care for benign tumor of corpus uteri, third trimester: Principal | ICD-10-CM | POA: Diagnosis present

## 2021-12-10 DIAGNOSIS — Z8659 Personal history of other mental and behavioral disorders: Secondary | ICD-10-CM

## 2021-12-10 DIAGNOSIS — D259 Leiomyoma of uterus, unspecified: Secondary | ICD-10-CM | POA: Diagnosis present

## 2021-12-10 DIAGNOSIS — Z3490 Encounter for supervision of normal pregnancy, unspecified, unspecified trimester: Secondary | ICD-10-CM | POA: Diagnosis present

## 2021-12-10 LAB — CBC
HCT: 37.9 % (ref 36.0–46.0)
Hemoglobin: 13.1 g/dL (ref 12.0–15.0)
MCH: 29.9 pg (ref 26.0–34.0)
MCHC: 34.6 g/dL (ref 30.0–36.0)
MCV: 86.5 fL (ref 80.0–100.0)
Platelets: 309 10*3/uL (ref 150–400)
RBC: 4.38 MIL/uL (ref 3.87–5.11)
RDW: 14.9 % (ref 11.5–15.5)
WBC: 8.6 10*3/uL (ref 4.0–10.5)
nRBC: 0 % (ref 0.0–0.2)

## 2021-12-10 LAB — RPR: RPR Ser Ql: NONREACTIVE

## 2021-12-10 LAB — TYPE AND SCREEN
ABO/RH(D): O POS
Antibody Screen: NEGATIVE

## 2021-12-10 MED ORDER — FENTANYL CITRATE (PF) 100 MCG/2ML IJ SOLN
100.0000 ug | INTRAMUSCULAR | Status: DC | PRN
  Administered 2021-12-10 – 2021-12-11 (×4): 100 ug via INTRAVENOUS
  Filled 2021-12-10 (×4): qty 2

## 2021-12-10 MED ORDER — LACTATED RINGERS IV SOLN: INTRAVENOUS | Status: DC

## 2021-12-10 MED ORDER — MISOPROSTOL 25 MCG QUARTER TABLET: 25.0000 ug | ORAL_TABLET | Freq: Once | ORAL | Status: DC

## 2021-12-10 MED ORDER — OXYTOCIN-SODIUM CHLORIDE 30-0.9 UT/500ML-% IV SOLN
1.0000 m[IU]/min | INTRAVENOUS | Status: DC
  Administered 2021-12-11: 2 m[IU]/min via INTRAVENOUS
  Filled 2021-12-10: qty 500

## 2021-12-10 MED ORDER — MISOPROSTOL 25 MCG QUARTER TABLET: 25.0000 ug | ORAL_TABLET | ORAL | Status: DC | PRN

## 2021-12-10 MED ORDER — MISOPROSTOL 50MCG HALF TABLET
50.0000 ug | ORAL_TABLET | Freq: Once | ORAL | Status: AC
  Administered 2021-12-10: 50 ug via VAGINAL
  Filled 2021-12-10: qty 1

## 2021-12-10 MED ORDER — LIDOCAINE HCL (PF) 1 % IJ SOLN: 30.0000 mL | INTRAMUSCULAR | Status: DC | PRN

## 2021-12-10 MED ORDER — OXYTOCIN BOLUS FROM INFUSION: 333.0000 mL | Freq: Once | INTRAVENOUS | Status: DC

## 2021-12-10 MED ORDER — OXYTOCIN-SODIUM CHLORIDE 30-0.9 UT/500ML-% IV SOLN: 2.5000 [IU]/h | INTRAVENOUS | Status: DC

## 2021-12-10 MED ORDER — LACTATED RINGERS IV SOLN
500.0000 mL | INTRAVENOUS | Status: DC | PRN
  Administered 2021-12-11: 1000 mL via INTRAVENOUS

## 2021-12-10 MED ORDER — SOD CITRATE-CITRIC ACID 500-334 MG/5ML PO SOLN
30.0000 mL | ORAL | Status: DC | PRN
  Administered 2021-12-10 – 2021-12-11 (×2): 30 mL via ORAL
  Filled 2021-12-10 (×2): qty 30

## 2021-12-10 MED ORDER — TERBUTALINE SULFATE 1 MG/ML IJ SOLN
0.2500 mg | Freq: Once | INTRAMUSCULAR | Status: AC | PRN
  Administered 2021-12-11: 0.25 mg via SUBCUTANEOUS
  Filled 2021-12-10: qty 1

## 2021-12-10 MED ORDER — FENTANYL CITRATE (PF) 100 MCG/2ML IJ SOLN: 100.0000 ug | Freq: Once | INTRAMUSCULAR | Status: DC

## 2021-12-10 MED ORDER — ONDANSETRON HCL 4 MG/2ML IJ SOLN
4.0000 mg | Freq: Four times a day (QID) | INTRAMUSCULAR | Status: DC | PRN
  Administered 2021-12-10 – 2021-12-11 (×4): 4 mg via INTRAVENOUS
  Filled 2021-12-10 (×3): qty 2

## 2021-12-10 MED ORDER — ACETAMINOPHEN 325 MG PO TABS: 650.0000 mg | ORAL_TABLET | ORAL | Status: DC | PRN

## 2021-12-10 NOTE — Progress Notes (Signed)
Patient ID: Emily Pham, female   DOB: 1980-08-08, 41 y.o.   MRN: [id number] 41 yo G1, 39 wks IOL for AMA. Has fibroids. GBS neg  Ht 5' 3.5" (1.613 m)   Wt 97.5 kg   LMP 03/12/2021   BMI 37.47 kg/m   FHT 145-150s / mod variability, + accels, no decels - cat I Toco none  SVE closed, soft, Vx -3-4.  Did not attempt cervical balloon since cx closed, can assess and attempt after 1st cytotec  Cytotec 50 mcg vaginal now then 25 mcg q 4 hrs as needed.  Pain meds options reviewed. Ambulate. Ok to eat light diet now until UCs begin  Pt agrees with plan  --Azucena Fallen MD

## 2021-12-11 ENCOUNTER — Other Ambulatory Visit: Payer: Self-pay

## 2021-12-11 ENCOUNTER — Inpatient Hospital Stay (HOSPITAL_COMMUNITY): Payer: BC Managed Care – PPO | Admitting: Anesthesiology

## 2021-12-11 ENCOUNTER — Encounter (HOSPITAL_COMMUNITY)
Admission: AD | Disposition: A | Discharge status: Home / Self Care | Payer: Self-pay | Source: Home / Self Care | Attending: Obstetrics

## 2021-12-11 ENCOUNTER — Encounter (HOSPITAL_COMMUNITY): Payer: Self-pay | Admitting: Obstetrics

## 2021-12-11 DIAGNOSIS — Z8659 Personal history of other mental and behavioral disorders: Secondary | ICD-10-CM

## 2021-12-11 DIAGNOSIS — D259 Leiomyoma of uterus, unspecified: Secondary | ICD-10-CM | POA: Diagnosis present

## 2021-12-11 DIAGNOSIS — Z98891 History of uterine scar from previous surgery: Secondary | ICD-10-CM

## 2021-12-11 SURGERY — Surgical Case
Anesthesia: Epidural

## 2021-12-11 MED ORDER — DIBUCAINE (PERIANAL) 1 % EX OINT: 1.0000 | TOPICAL_OINTMENT | CUTANEOUS | Status: DC | PRN

## 2021-12-11 MED ORDER — MORPHINE SULFATE (PF) 0.5 MG/ML IJ SOLN
INTRAMUSCULAR | Status: AC
  Filled 2021-12-11: qty 10

## 2021-12-11 MED ORDER — DIPHENHYDRAMINE HCL 50 MG/ML IJ SOLN: 12.5000 mg | INTRAMUSCULAR | Status: DC | PRN

## 2021-12-11 MED ORDER — ACETAMINOPHEN 10 MG/ML IV SOLN
INTRAVENOUS | Status: DC | PRN
  Administered 2021-12-11: 1000 mg via INTRAVENOUS

## 2021-12-11 MED ORDER — LACTATED RINGERS IV SOLN: INTRAVENOUS | Status: DC

## 2021-12-11 MED ORDER — LIDOCAINE HCL (PF) 1 % IJ SOLN
INTRAMUSCULAR | Status: DC | PRN
  Administered 2021-12-11: 5 mL via EPIDURAL

## 2021-12-11 MED ORDER — EPHEDRINE 5 MG/ML INJ: 10.0000 mg | INTRAVENOUS | Status: DC | PRN

## 2021-12-11 MED ORDER — KETOROLAC TROMETHAMINE 30 MG/ML IJ SOLN
30.0000 mg | Freq: Once | INTRAMUSCULAR | Status: AC | PRN
  Administered 2021-12-11: 30 mg via INTRAVENOUS

## 2021-12-11 MED ORDER — COCONUT OIL OIL: 1.0000 | TOPICAL_OIL | Status: DC | PRN

## 2021-12-11 MED ORDER — PHENYLEPHRINE 80 MCG/ML (10ML) SYRINGE FOR IV PUSH (FOR BLOOD PRESSURE SUPPORT)
80.0000 ug | PREFILLED_SYRINGE | INTRAVENOUS | Status: DC | PRN
  Filled 2021-12-11: qty 10

## 2021-12-11 MED ORDER — OXYTOCIN-SODIUM CHLORIDE 30-0.9 UT/500ML-% IV SOLN
2.5000 [IU]/h | INTRAVENOUS | Status: AC
  Filled 2021-12-11: qty 500

## 2021-12-11 MED ORDER — SENNOSIDES-DOCUSATE SODIUM 8.6-50 MG PO TABS
2.0000 | ORAL_TABLET | ORAL | Status: DC
  Administered 2021-12-11 – 2021-12-12 (×2): 2 via ORAL
  Filled 2021-12-11 (×2): qty 2

## 2021-12-11 MED ORDER — SCOPOLAMINE 1 MG/3DAYS TD PT72
MEDICATED_PATCH | TRANSDERMAL | Status: AC
  Filled 2021-12-11: qty 1

## 2021-12-11 MED ORDER — OXYCODONE HCL 5 MG PO TABS
5.0000 mg | ORAL_TABLET | ORAL | Status: DC | PRN
  Administered 2021-12-12 – 2021-12-13 (×5): 5 mg via ORAL
  Filled 2021-12-11 (×5): qty 1

## 2021-12-11 MED ORDER — TRANEXAMIC ACID-NACL 1000-0.7 MG/100ML-% IV SOLN
INTRAVENOUS | Status: DC | PRN
  Administered 2021-12-11: 1000 mg via INTRAVENOUS

## 2021-12-11 MED ORDER — FAMOTIDINE IN NACL 20-0.9 MG/50ML-% IV SOLN
20.0000 mg | Freq: Once | INTRAVENOUS | Status: AC
  Administered 2021-12-11: 20 mg via INTRAVENOUS
  Filled 2021-12-11: qty 50

## 2021-12-11 MED ORDER — ACETAMINOPHEN 500 MG PO TABS
1000.0000 mg | ORAL_TABLET | Freq: Four times a day (QID) | ORAL | Status: DC
  Administered 2021-12-11 – 2021-12-13 (×7): 1000 mg via ORAL
  Filled 2021-12-11 (×7): qty 2

## 2021-12-11 MED ORDER — TETANUS-DIPHTH-ACELL PERTUSSIS 5-2.5-18.5 LF-MCG/0.5 IM SUSY: 0.5000 mL | PREFILLED_SYRINGE | Freq: Once | INTRAMUSCULAR | Status: DC

## 2021-12-11 MED ORDER — MENTHOL 3 MG MT LOZG
1.0000 | LOZENGE | OROMUCOSAL | Status: DC | PRN
  Administered 2021-12-12: 3 mg via ORAL
  Filled 2021-12-11: qty 9

## 2021-12-11 MED ORDER — FENTANYL-BUPIVACAINE-NACL 0.5-0.125-0.9 MG/250ML-% EP SOLN
12.0000 mL/h | EPIDURAL | Status: DC | PRN
  Filled 2021-12-11: qty 250

## 2021-12-11 MED ORDER — PHENYLEPHRINE HCL (PRESSORS) 10 MG/ML IV SOLN
INTRAVENOUS | Status: DC | PRN
  Administered 2021-12-11: 80 ug via INTRAVENOUS

## 2021-12-11 MED ORDER — DEXAMETHASONE SODIUM PHOSPHATE 4 MG/ML IJ SOLN
INTRAMUSCULAR | Status: DC | PRN
  Administered 2021-12-11: 4 mg via INTRAVENOUS

## 2021-12-11 MED ORDER — ZOLPIDEM TARTRATE 5 MG PO TABS: 5.0000 mg | ORAL_TABLET | Freq: Every evening | ORAL | Status: DC | PRN

## 2021-12-11 MED ORDER — PHENYLEPHRINE 80 MCG/ML (10ML) SYRINGE FOR IV PUSH (FOR BLOOD PRESSURE SUPPORT): 80.0000 ug | PREFILLED_SYRINGE | INTRAVENOUS | Status: DC | PRN

## 2021-12-11 MED ORDER — DEXMEDETOMIDINE HCL IN NACL 80 MCG/20ML IV SOLN
INTRAVENOUS | Status: DC | PRN
  Administered 2021-12-11 (×2): 1 mL via BUCCAL

## 2021-12-11 MED ORDER — WITCH HAZEL-GLYCERIN EX PADS: 1.0000 | MEDICATED_PAD | CUTANEOUS | Status: DC | PRN

## 2021-12-11 MED ORDER — LIDOCAINE-EPINEPHRINE (PF) 2 %-1:200000 IJ SOLN
INTRAMUSCULAR | Status: DC | PRN
  Administered 2021-12-11 (×2): 5 mL via EPIDURAL

## 2021-12-11 MED ORDER — CEFAZOLIN SODIUM-DEXTROSE 2-4 GM/100ML-% IV SOLN
INTRAVENOUS | Status: AC
  Filled 2021-12-11: qty 100

## 2021-12-11 MED ORDER — TRANEXAMIC ACID-NACL 1000-0.7 MG/100ML-% IV SOLN
INTRAVENOUS | Status: AC
  Filled 2021-12-11: qty 100

## 2021-12-11 MED ORDER — LACTATED RINGERS IV SOLN
500.0000 mL | Freq: Once | INTRAVENOUS | Status: AC
  Administered 2021-12-11: 500 mL via INTRAVENOUS

## 2021-12-11 MED ORDER — KETOROLAC TROMETHAMINE 30 MG/ML IJ SOLN
30.0000 mg | Freq: Four times a day (QID) | INTRAMUSCULAR | Status: AC
  Administered 2021-12-11 – 2021-12-12 (×4): 30 mg via INTRAVENOUS
  Filled 2021-12-11 (×4): qty 1

## 2021-12-11 MED ORDER — OXYTOCIN-SODIUM CHLORIDE 30-0.9 UT/500ML-% IV SOLN
INTRAVENOUS | Status: DC | PRN
  Administered 2021-12-11: 300 mL via INTRAVENOUS

## 2021-12-11 MED ORDER — MORPHINE SULFATE (PF) 0.5 MG/ML IJ SOLN
INTRAMUSCULAR | Status: DC | PRN
  Administered 2021-12-11: 3 mg via EPIDURAL

## 2021-12-11 MED ORDER — PRENATAL MULTIVITAMIN CH
1.0000 | ORAL_TABLET | Freq: Every day | ORAL | Status: DC
  Administered 2021-12-13: 1 via ORAL
  Filled 2021-12-11 (×2): qty 1

## 2021-12-11 MED ORDER — TERBUTALINE SULFATE 1 MG/ML IJ SOLN: 0.2500 mg | Freq: Once | INTRAMUSCULAR | Status: DC

## 2021-12-11 MED ORDER — ACETAMINOPHEN 10 MG/ML IV SOLN
INTRAVENOUS | Status: AC
  Filled 2021-12-11: qty 100

## 2021-12-11 MED ORDER — IBUPROFEN 600 MG PO TABS
600.0000 mg | ORAL_TABLET | Freq: Four times a day (QID) | ORAL | Status: DC
  Administered 2021-12-12 – 2021-12-13 (×5): 600 mg via ORAL
  Filled 2021-12-11 (×5): qty 1

## 2021-12-11 MED ORDER — KETOROLAC TROMETHAMINE 30 MG/ML IJ SOLN
INTRAMUSCULAR | Status: AC
  Filled 2021-12-11: qty 1

## 2021-12-11 MED ORDER — PHENYLEPHRINE HCL-NACL 20-0.9 MG/250ML-% IV SOLN
INTRAVENOUS | Status: DC | PRN
  Administered 2021-12-11: 60 ug/min via INTRAVENOUS

## 2021-12-11 MED ORDER — SIMETHICONE 80 MG PO CHEW
80.0000 mg | CHEWABLE_TABLET | ORAL | Status: DC | PRN
  Administered 2021-12-12: 80 mg via ORAL

## 2021-12-11 MED ORDER — FENTANYL-BUPIVACAINE-NACL 0.5-0.125-0.9 MG/250ML-% EP SOLN
EPIDURAL | Status: DC | PRN
  Administered 2021-12-11: 12 mL/h via EPIDURAL

## 2021-12-11 MED ORDER — SCOPOLAMINE 1 MG/3DAYS TD PT72
MEDICATED_PATCH | TRANSDERMAL | Status: DC | PRN
  Administered 2021-12-11: 1 via TRANSDERMAL

## 2021-12-11 MED ORDER — SODIUM CHLORIDE 0.9 % IR SOLN
Status: DC | PRN
  Administered 2021-12-11: 1

## 2021-12-11 MED ORDER — DIPHENHYDRAMINE HCL 25 MG PO CAPS
25.0000 mg | ORAL_CAPSULE | Freq: Four times a day (QID) | ORAL | Status: DC | PRN
  Administered 2021-12-12 (×2): 25 mg via ORAL
  Filled 2021-12-11 (×2): qty 1

## 2021-12-11 MED ORDER — STERILE WATER FOR IRRIGATION IR SOLN
Status: DC | PRN
  Administered 2021-12-11: 1000 mL

## 2021-12-11 MED ORDER — LIDOCAINE-EPINEPHRINE (PF) 2 %-1:200000 IJ SOLN
INTRAMUSCULAR | Status: AC
  Filled 2021-12-11: qty 20

## 2021-12-11 MED ORDER — CEFAZOLIN SODIUM-DEXTROSE 2-3 GM-%(50ML) IV SOLR
INTRAVENOUS | Status: DC | PRN
  Administered 2021-12-11: 2 g via INTRAVENOUS

## 2021-12-11 MED ORDER — SIMETHICONE 80 MG PO CHEW
80.0000 mg | CHEWABLE_TABLET | Freq: Three times a day (TID) | ORAL | Status: DC
  Administered 2021-12-11 – 2021-12-13 (×4): 80 mg via ORAL
  Filled 2021-12-11 (×5): qty 1

## 2021-12-11 SURGICAL SUPPLY — 38 items
APL SKNCLS STERI-STRIP NONHPOA (GAUZE/BANDAGES/DRESSINGS) ×1
BENZOIN TINCTURE PRP APPL 2/3 (GAUZE/BANDAGES/DRESSINGS) IMPLANT
CHLORAPREP W/TINT 26ML (MISCELLANEOUS) ×4 IMPLANT
CLAMP CORD UMBIL (MISCELLANEOUS) ×2 IMPLANT
CLOTH BEACON ORANGE TIMEOUT ST (SAFETY) ×2 IMPLANT
DRSG OPSITE POSTOP 4X10 (GAUZE/BANDAGES/DRESSINGS) ×2 IMPLANT
ELECT REM PT RETURN 9FT ADLT (ELECTROSURGICAL) ×1
ELECTRODE REM PT RTRN 9FT ADLT (ELECTROSURGICAL) ×2 IMPLANT
EXTRACTOR VACUUM M CUP 4 TUBE (SUCTIONS) IMPLANT
GAUZE SPONGE 4X4 8PLY STRL (GAUZE/BANDAGES/DRESSINGS) IMPLANT
GLOVE BIO SURGEON STRL SZ 6.5 (GLOVE) ×2 IMPLANT
GLOVE BIOGEL PI IND STRL 7.0 (GLOVE) ×6 IMPLANT
GOWN STRL REUS W/TWL LRG LVL3 (GOWN DISPOSABLE) ×4 IMPLANT
HEMOSTAT ARISTA ABSORB 3G PWDR (HEMOSTASIS) IMPLANT
KIT ABG SYR 3ML LUER SLIP (SYRINGE) IMPLANT
NDL HYPO 25X5/8 SAFETYGLIDE (NEEDLE) IMPLANT
NEEDLE HYPO 22GX1.5 SAFETY (NEEDLE) IMPLANT
NEEDLE HYPO 25X5/8 SAFETYGLIDE (NEEDLE) IMPLANT
NS IRRIG 1000ML POUR BTL (IV SOLUTION) ×2 IMPLANT
PACK C SECTION WH (CUSTOM PROCEDURE TRAY) ×2 IMPLANT
PAD ABD 8X10 STRL (GAUZE/BANDAGES/DRESSINGS) IMPLANT
PAD OB MATERNITY 4.3X12.25 (PERSONAL CARE ITEMS) ×2 IMPLANT
STRIP CLOSURE SKIN 1/2X4 (GAUZE/BANDAGES/DRESSINGS) IMPLANT
SUT MON AB 4-0 PS1 27 (SUTURE) ×2 IMPLANT
SUT PLAIN 0 NONE (SUTURE) IMPLANT
SUT PLAIN 2 0 XLH (SUTURE) IMPLANT
SUT VIC AB 0 CT1 27 (SUTURE) ×1
SUT VIC AB 0 CT1 27XBRD ANBCTR (SUTURE) IMPLANT
SUT VIC AB 0 CT1 36 (SUTURE) ×4 IMPLANT
SUT VIC AB 0 CTX 36 (SUTURE) ×2
SUT VIC AB 0 CTX36XBRD ANBCTRL (SUTURE) ×4 IMPLANT
SUT VIC AB 2-0 CT1 27 (SUTURE) ×1
SUT VIC AB 2-0 CT1 TAPERPNT 27 (SUTURE) ×2 IMPLANT
SYR CONTROL 10ML LL (SYRINGE) IMPLANT
TAPE CLOTH SURG 4X10 WHT LF (GAUZE/BANDAGES/DRESSINGS) IMPLANT
TOWEL OR 17X24 6PK STRL BLUE (TOWEL DISPOSABLE) ×2 IMPLANT
TRAY FOLEY W/BAG SLVR 14FR LF (SET/KITS/TRAYS/PACK) IMPLANT
WATER STERILE IRR 1000ML POUR (IV SOLUTION) ×2 IMPLANT

## 2021-12-11 NOTE — Op Note (Signed)
12/10/2021 - 12/11/2021  1:05 PM  PATIENT:  Emily Pham  41 y.o. female  PRE-OPERATIVE DIAGNOSIS:  arrest of descent, advanced maternal age, fibroids, poor urine output  POST-OPERATIVE DIAGNOSIS:  arrest of descent, advanced maternal age, fibroids, poor urine output  PROCEDURE:  Procedure(s): CESAREAN SECTION (N/A) Primary low transverse cesarean section with 2 layer closure  SURGEON:  Surgeon(s) and Role:    * Aloha Gell, MD - Primary  PHYSICIAN ASSISTANT:   ASSISTANTS: Gavin Potters, CNM  ANESTHESIA:   epidural  EBL: Per nursing notes  BLOOD ADMINISTERED:none  DRAINS: Urinary Catheter (Foley)   LOCAL MEDICATIONS USED:  NONE  SPECIMEN: Placenta  DISPOSITION OF SPECIMEN:  Disposal  COUNTS:  YES  TOURNIQUET:  * No tourniquets in log *  DICTATION: .Note written in EPIC  PLAN OF CARE: Admit to inpatient   PATIENT DISPOSITION:  PACU - hemodynamically stable.   Delay start of Pharmacological VTE agent (>24hrs) due to surgical blood loss or risk of bleeding: yes    Findings:  '@BABYSEXEBC'$ @ infant,  APGAR (1 MIN): 8   APGAR (5 MINS): 9   APGAR (10 MINS):   Enlarged uterus with multiple fibroids, normal tubes and ovaries, normal placenta. 3VC, clear amniotic fluid  EBL: Per nursing cc Antibiotics:   2g Ancef Complications: none  Indications: This is a 41 y.o. year-old, G1 at 45w1dadmitted for induction of labor due to advanced maternal age and fibroids complicating pregnancy.  Patient had active labor after single Cytotec and cervical Foley balloon.  Patient did progress to 10 cm although the fetal station remained high.  Intermittently throughout the labor she had rounds of variable and late decelerations.  She did need terbutaline through the labor course.  It was only after she made it to 9 cm that low-dose Pitocin was started.  Patient slowly changed from 9 to 10 cm over about 3 hours.  After 2 hours of pushing she had no further descent and fetal station  decision was made to proceed with primary cesarean section risks benefits and alternatives of the procedure were discussed with the patient who agreed to proceed  Procedure:  After informed consent was obtained the patient was taken to the operating room where epidural anesthesia was found to be adequate.  She was prepped and draped in the normal sterile fashion in dorsal supine position with a leftward tilt.  A foley catheter was in place.  A Pfannenstiel skin incision was made 2 cm above the pubic symphysis in the midline with the scalpel.  Dissection was carried down with the Bovie cautery until the fascia was reached. The fascia was incised in the midline. The incision was extended laterally with the Mayo scissors. The inferior aspect of the fascial incision was grasped with the Coker clamps, elevated up and the underlying rectus muscles were dissected off sharply. The superior aspect of the fascial incision was grasped with the Coker clamps elevated up and the underlying rectus muscles were dissected off sharply.  The peritoneum was entered sharply and cautiously.  Of note patient had several IV fluid boluses and Foley catheter was changed during labor course yet patient had 0 urine output over the last 4 hours of her labor.  The bladder did not feel over distended. The peritoneal incision was extended superiorly and inferiorly with good visualization of the bladder. The bladder blade was inserted and palpation was done to assess the fetal position and the location of the uterine vessels. The lower segment of the uterus  was incised sharply with the scalpel and extended  bluntly in the cephalo-caudal fashion.  Given the significant molding in the low station a hand was placed in the vagina and used to elevate the vertex cephalad.  The infant was grasped, brought to the incision,  rotated and the infant was delivered with fundal pressure. The nose and mouth were bulb suctioned. The cord was clamped and cut  after one half minute delay. The infant was handed off to the waiting pediatrician. The placenta was expressed. The uterus was exteriorized. The uterus was cleared of all clots and debris.  A right inferior extension was noted with active bleeding.  The uterine incision was repaired with 0 Vicryl in a running locked fashion.  A second layer of the same suture was used in an imbricating fashion to obtain excellent hemostasis.  Several sutures were used needed to close the right inferior extension.  the uterus was then returned to the abdomen, the gutters were cleared of all clots and debris. The uterine incision was reinspected and found to be hemostatic.  Arista was placed over the incision.  The peritoneum was grasped and closed with 2-0 Vicryl in a running fashion. The cut muscle edges and the underside of the fascia were inspected and found to be hemostatic. The fascia was closed with 0 Vicryl in a single layer . The subcutaneous tissue was irrigated. Scarpa's layer was closed with a 2-0 plain gut suture. The skin was closed with a 4-0 Monocryl in a single layer. The patient tolerated the procedure well. Sponge lap and needle counts were correct x3 and patient was taken to the recovery room in a stable condition.  Ala Dach 12/11/2021 1:07 PM

## 2021-12-11 NOTE — Transfer of Care (Signed)
Immediate Anesthesia Transfer of Care Note  Patient: Emily Pham  Procedure(s) Performed: CESAREAN SECTION  Patient Location: PACU  Anesthesia Type:Epidural  Level of Consciousness: awake, alert  and oriented  Airway & Oxygen Therapy: Patient Spontanous Breathing  Post-op Assessment: Report given to RN and Post -op Vital signs reviewed and stable  Post vital signs: Reviewed and stable  Last Vitals:  Vitals Value Taken Time  BP 118/82 12/11/21 1320  Temp    Pulse 69 12/11/21 1323  Resp 23 12/11/21 1323  SpO2 100 % 12/11/21 1323  Vitals shown include unvalidated device data.  Last Pain:  Vitals:   12/11/21 0900  TempSrc: Oral  PainSc: 0-No pain         Complications: No notable events documented.

## 2021-12-11 NOTE — Anesthesia Postprocedure Evaluation (Signed)
Anesthesia Post Note  Patient: Emily Pham  Procedure(s) Performed: CESAREAN SECTION     Patient location during evaluation: PACU Anesthesia Type: Epidural Level of consciousness: oriented and awake and alert Pain management: pain level controlled Vital Signs Assessment: post-procedure vital signs reviewed and stable Respiratory status: spontaneous breathing, respiratory function stable and patient connected to nasal cannula oxygen Cardiovascular status: blood pressure returned to baseline and stable Postop Assessment: no headache, no backache and no apparent nausea or vomiting Anesthetic complications: no   No notable events documented.  Last Vitals:  Vitals:   12/11/21 1430 12/11/21 1532  BP: 115/78 134/80  Pulse: 67 72  Resp: 20 18  Temp: 36.7 C 36.8 C  SpO2: 100% 100%    Last Pain:  Vitals:   12/11/21 1532  TempSrc: Oral  PainSc:                  March Rummage Katheryn Culliton

## 2021-12-11 NOTE — Anesthesia Procedure Notes (Signed)
Epidural Patient location during procedure: OB Start time: 12/11/2021 2:22 AM End time: 12/11/2021 2:35 AM  Staffing Anesthesiologist: Barnet Glasgow, MD Performed: anesthesiologist   Preanesthetic Checklist Completed: patient identified, IV checked, site marked, risks and benefits discussed, surgical consent, monitors and equipment checked, pre-op evaluation and timeout performed  Epidural Patient position: sitting Prep: DuraPrep and site prepped and draped Patient monitoring: continuous pulse ox and blood pressure Approach: midline Location: L3-L4 Injection technique: LOR air  Needle:  Needle type: Tuohy  Needle gauge: 17 G Needle length: 9 cm and 9 Needle insertion depth: 8 cm Catheter type: closed end flexible Catheter size: 19 Gauge Catheter at skin depth: 13 cm Test dose: negative  Assessment Events: blood not aspirated, injection not painful, no injection resistance, no paresthesia and negative IV test  Additional Notes Patient identified. Risks/Benefits/Options discussed with patient including but not limited to bleeding, infection, nerve damage, paralysis, failed block, incomplete pain control, headache, blood pressure changes, nausea, vomiting, reactions to medication both or allergic, itching and postpartum back pain. Confirmed with bedside nurse the patient's most recent platelet count. Confirmed with patient that they are not currently taking any anticoagulation, have any bleeding history or any family history of bleeding disorders. Patient expressed understanding and wished to proceed. All questions were answered. Sterile technique was used throughout the entire procedure. Please see nursing notes for vital signs. Test dose was given through epidural needle and negative prior to continuing to dose epidural or start infusion. Warning signs of high block given to the patient including shortness of breath, tingling/numbness in hands, complete motor block, or any  concerning symptoms with instructions to call for help. Patient was given instructions on fall risk and not to get out of bed. All questions and concerns addressed with instructions to call with any issues.  1 Attempt (S) . Patient tolerated procedure well.

## 2021-12-11 NOTE — Lactation Note (Signed)
This note was copied from a baby's chart. Lactation Consultation Note  Patient Name: Emily Pham Today's Date: 12/11/2021 Reason for consult: Initial assessment;1st time breastfeeding;Term;Other (Comment) (C/S delivery, AMA-See Birth Parent MR) Age:40 hours Birth Parent latched infant on her right breast using the football hold position, infant was on and off the breast for 12 minutes. Birth Parent will continue to work towards latching infant at the breast and knows to ask RN/LC for further latch assistance if needed. Birth Parent will continue to BF infant according to hunger cues, on demand, 8 to 12 x within 24 hrs, STS. Birth Parent self express a few drops of colostrum , LC taught hand expression using breast model. Birth Parent was  made aware of O/P services, breastfeeding support groups, community resources, and our phone # for post-discharge questions.   Maternal Data Has patient been taught Hand Expression?: Yes Does the patient have breastfeeding experience prior to this delivery?: No  Feeding Mother's Current Feeding Choice: Breast Milk  LATCH Score Latch: Repeated attempts needed to sustain latch, nipple held in mouth throughout feeding, stimulation needed to elicit sucking reflex.  Audible Swallowing: A few with stimulation  Type of Nipple: Everted at rest and after stimulation  Comfort (Breast/Nipple): Soft / non-tender  Hold (Positioning): Assistance needed to correctly position infant at breast and maintain latch.  LATCH Score: 7   Lactation Tools Discussed/Used    Interventions Interventions: Breast feeding basics reviewed;Assisted with latch;Skin to skin;Hand express;Breast compression;Adjust position;Support pillows;Position options;Expressed milk;Education;LC Services brochure  Discharge Pump: Personal  Consult Status Consult Status: Follow-up Date: 12/12/21 Follow-up type: In-patient    Vicente Serene 12/11/2021, 6:41 PM

## 2021-12-11 NOTE — Progress Notes (Signed)
S: Doing well, no complaints, pain well controlled with epidural  Cytotec x 1 PV at 12p Cervical foley placed around 4p Foley expulsed and SROM aroun 150am Received epidural and started with variable decels  O: BP 115/79   Pulse (!) 122   Temp 99.7 F (37.6 C) (Oral)   Resp 14   Ht 5' 3.5" (1.613 m)   Wt 97.5 kg   LMP 03/12/2021   SpO2 98%   BMI 37.47 kg/m    FHT:  FHR: 145s  bpm, variability: moderate,  accelerations:  Present,  decelerations:  Absent at this time. Pt however for the past 3 hrs with tachysystole and variable decels, baseline increased to 175s with decels to 120-140s, single prolonged 3 min decels, always withi good variablity within decel.  UC:   regular, every 3 minutes SVE:   Dilation: 7 Effacement (%): 100 Station: 0 Exam by:: Jerral Bonito, MD  IUPC placed.   A / P:  41 y.o.  OB History  Gravida Para Term Preterm AB Living  1 0 0 0 0 0  SAB IAB Ectopic Multiple Live Births  0 0 0 0 0   at 32w1dI[place] wks [place]  Cytotec x 1 PV at 12p Cervical foley placed around 4p Foley expulsed and SROM aroun 150am Received epidural and started with variable decels Decels more late in timing- fluid bolus given (also decreased urine output noted and lower bp) and now FHT back to baseline without decels IUPC placed, low intensity contractions  Will allow 1 hr fetal recovery/ hydration then start pitocin to get adequte ctx.   Fetal Wellbeing:  Category II Pain Control:  Epidural  Anticipated MOD:   Unclear, known fibroids and currently with prolonged active phase though will start pitocin soon   KAla Dach9/14/2023, 6:03 AM

## 2021-12-11 NOTE — Progress Notes (Signed)
Start pitocin at 2 m-units at 0630 per provider. Also order IV pepcid, '20mg'$  to start ASAP per provider. VD RN

## 2021-12-11 NOTE — Brief Op Note (Signed)
12/10/2021 - 12/11/2021  1:05 PM  PATIENT:  Emily Pham  41 y.o. female  PRE-OPERATIVE DIAGNOSIS:  arrest of descent, advanced maternal age, fibroids, poor urine output  POST-OPERATIVE DIAGNOSIS:  arrest of descent, advanced maternal age, fibroids, poor urine output  PROCEDURE:  Procedure(s): CESAREAN SECTION (N/A) Primary low transverse cesarean section with 2 layer closure  SURGEON:  Surgeon(s) and Role:    * Aloha Gell, MD - Primary  PHYSICIAN ASSISTANT:   ASSISTANTS: Gavin Potters, CNM  ANESTHESIA:   epidural  EBL: Per nursing notes  BLOOD ADMINISTERED:none  DRAINS: Urinary Catheter (Foley)   LOCAL MEDICATIONS USED:  NONE  SPECIMEN: Placenta  DISPOSITION OF SPECIMEN:   Disposal  COUNTS:  YES  TOURNIQUET:  * No tourniquets in log *  DICTATION: .Note written in EPIC  PLAN OF CARE: Admit to inpatient   PATIENT DISPOSITION:  PACU - hemodynamically stable.   Delay start of Pharmacological VTE agent (>24hrs) due to surgical blood loss or risk of bleeding: yes

## 2021-12-11 NOTE — Anesthesia Preprocedure Evaluation (Addendum)
Anesthesia Evaluation  Patient identified by MRN, date of birth, ID band Patient awake    Reviewed: Allergy & Precautions, NPO status , Patient's Chart, lab work & pertinent test results  Airway Mallampati: II  TM Distance: >3 FB Neck ROM: Full    Dental no notable dental hx. (+) Teeth Intact, Dental Advisory Given   Pulmonary    Pulmonary exam normal breath sounds clear to auscultation       Cardiovascular hypertension, Normal cardiovascular exam Rhythm:Regular Rate:Normal     Neuro/Psych    GI/Hepatic Neg liver ROS,   Endo/Other    Renal/GU negative Renal ROS     Musculoskeletal   Abdominal (+) + obese (BMI 37.5),   Peds  Hematology Lab Results      Component                Value               Date                      WBC                      8.6                 12/10/2021                HGB                      13.1                12/10/2021                HCT                      37.9                12/10/2021                PLT                      309                 12/10/2021              Anesthesia Other Findings   Reproductive/Obstetrics (+) Pregnancy                            Anesthesia Physical Anesthesia Plan  ASA: 3  Anesthesia Plan: Epidural   Post-op Pain Management:    Induction:   PONV Risk Score and Plan:   Airway Management Planned:   Additional Equipment:   Intra-op Plan:   Post-operative Plan:   Informed Consent: I have reviewed the patients History and Physical, chart, labs and discussed the procedure including the risks, benefits and alternatives for the proposed anesthesia with the patient or authorized representative who has indicated his/her understanding and acceptance.       Plan Discussed with:   Anesthesia Plan Comments: (39.1 wk Primagravida w BMi of 37.5 for LEA)       Anesthesia Quick Evaluation

## 2021-12-12 LAB — COMPREHENSIVE METABOLIC PANEL
ALT: 19 U/L (ref 0–44)
AST: 35 U/L (ref 15–41)
Albumin: 2 g/dL — ABNORMAL LOW (ref 3.5–5.0)
Alkaline Phosphatase: 103 U/L (ref 38–126)
Anion gap: 7 (ref 5–15)
BUN: 9 mg/dL (ref 6–20)
CO2: 24 mmol/L (ref 22–32)
Calcium: 9 mg/dL (ref 8.9–10.3)
Chloride: 106 mmol/L (ref 98–111)
Creatinine, Ser: 1.02 mg/dL — ABNORMAL HIGH (ref 0.44–1.00)
GFR, Estimated: >60 mL/min (ref >60)
Glucose, Bld: 83 mg/dL (ref 70–99)
Potassium: 4.2 mmol/L (ref 3.5–5.1)
Sodium: 137 mmol/L (ref 135–145)
Total Bilirubin: 0.8 mg/dL (ref 0.3–1.2)
Total Protein: 5.2 g/dL — ABNORMAL LOW (ref 6.5–8.1)

## 2021-12-12 LAB — CBC
HCT: 33.7 % — ABNORMAL LOW (ref 36.0–46.0)
Hemoglobin: 11.2 g/dL — ABNORMAL LOW (ref 12.0–15.0)
MCH: 29.5 pg (ref 26.0–34.0)
MCHC: 33.2 g/dL (ref 30.0–36.0)
MCV: 88.7 fL (ref 80.0–100.0)
Platelets: 263 10*3/uL (ref 150–400)
RBC: 3.8 MIL/uL — ABNORMAL LOW (ref 3.87–5.11)
RDW: 15 % (ref 11.5–15.5)
WBC: 10.8 10*3/uL — ABNORMAL HIGH (ref 4.0–10.5)
nRBC: 0 % (ref 0.0–0.2)

## 2021-12-12 NOTE — Progress Notes (Signed)
Subjective: Postpartum Day 1: Cesarean Delivery Patient reports nausea, incisional pain, tolerating PO, + flatus, + BM, and no problems voiding.    Objective: Vital signs in last 24 hours: Temp:  [98 F (36.7 C)-99 F (37.2 C)] 98.4 F (36.9 C) (09/15 0500) Pulse Rate:  [62-112] 65 (09/15 0500) Resp:  [17-22] 18 (09/15 0500) BP: (114-150)/(71-88) 120/71 (09/15 0500) SpO2:  [99 %-100 %] 100 % (09/14 2350)  Physical Exam:  General: alert, cooperative, and appears stated age 42: appropriate Uterine Fundus: soft Incision: healing well, no significant drainage DVT Evaluation: No evidence of DVT seen on physical exam. Negative Homan's sign.  Recent Labs    12/10/21 1131 12/12/21 0534  HGB 13.1 11.2*  HCT 37.9 33.7*    Assessment/Plan: Status post Cesarean section. Doing well postoperatively.  Continue current care.  Lovenia Kim, MD 12/12/2021, 10:15 AM

## 2021-12-13 MED ORDER — OXYCODONE HCL 5 MG PO TABS: 5.0000 mg | ORAL_TABLET | ORAL | 0 refills | Status: DC | PRN

## 2021-12-13 MED ORDER — IBUPROFEN 600 MG PO TABS: 600.0000 mg | ORAL_TABLET | Freq: Four times a day (QID) | ORAL | 0 refills | Status: DC

## 2021-12-13 NOTE — Progress Notes (Addendum)
No c/o tol po, ambulating, voids w/o difficulty, +flatus and bm N/v resolved; pain better controlled Nml lochia breastfeeding   Patient Vitals for the past 24 hrs:  BP Temp Temp src Pulse Resp SpO2  12/13/21 0545 119/80 98.2 F (36.8 C) Oral 68 18 100 %  12/12/21 1752 114/72 98 F (36.7 C) -- 66 18 --   A&ox3 Rrr Ctab Abd: +bs, soft,nt, nd; fundus firm and below umb; dressing: c/d/I LE: no edema, nt bilat     Latest Ref Rng & Units 12/12/2021    5:34 AM 12/10/2021   11:31 AM 04/17/2021    9:26 AM  CBC  WBC 4.0 - 10.5 K/uL 10.8  8.6  7.1   Hemoglobin 12.0 - 15.0 g/dL 11.2  13.1  13.6   Hematocrit 36.0 - 46.0 % 33.7  37.9  40.9   Platelets 150 - 400 K/uL 263  309  343    A/P: ppd2 s/p ltcs for arrest of descent Doing well, contin care; wants d/c home today; plan f/u 6 wk Mild anemia d/t abl - asymptomatic Rh pos RI

## 2021-12-13 NOTE — Discharge Summary (Signed)
Postpartum Discharge Summary  Date of Service updated     Patient Name: Emily Pham DOB: 01-28-1981 MRN: [id number]  Date of admission: 12/10/2021 Delivery date:12/11/2021  Delivering provider: Aloha Gell  Date of discharge: 12/13/2021  Admitting diagnosis: Encounter for induction of labor [Z34.90] Intrauterine pregnancy: [redacted]w[redacted]d    Secondary diagnosis:  Principal Problem:   Postpartum care following cesarean delivery 9/14 Active Problems:   Encounter for induction of labor   History of depression   Uterine fibroids affecting pregnancy   Failed induction of labor - AOD   Status post primary low transverse cesarean section  Additional problems: none    Discharge diagnosis: Term Pregnancy Delivered and Anemia                                              Post partum procedures: none Augmentation: Pitocin, Cytotec, and IP Foley Complications: None  Hospital course: Induction of Labor With Cesarean Section   41y.o. yo G1P1001 at 353w1das admitted to the hospital 12/10/2021 for induction of labor. Patient had a labor course significant for arrest of descent. The patient went for cesarean section due to Arrest of Descent. Delivery details are as follows: Membrane Rupture Time/Date: 1:55 AM ,12/11/2021   Delivery Method:C-Section, Low Transverse  Details of operation can be found in separate operative Note.  Patient had an uncomplicated postpartum course. She is ambulating, tolerating a regular diet, passing flatus, and urinating well.  Patient is discharged home in stable condition on 12/13/21.      Newborn Data: Birth date:12/11/2021  Birth time:12:28 PM  Gender:Female  Living status:Living  Apgars:8 ,9  Weight:3240 g                                Physical exam  Vitals:   12/12/21 0500 12/12/21 1752 12/13/21 0545 12/13/21 1340  BP: 120/71 114/72 119/80 (!) 135/94  Pulse: 65 66 68 72  Resp: '18 18 18 20  '$ Temp: 98.4 F (36.9 C) 98 F (36.7 C) 98.2 F (36.8 C) 98.1  F (36.7 C)  TempSrc: Oral  Oral Oral  SpO2:   100%   Weight:      Height:       See progress note Labs: Lab Results  Component Value Date   WBC 10.8 (H) 12/12/2021   HGB 11.2 (L) 12/12/2021   HCT 33.7 (L) 12/12/2021   MCV 88.7 12/12/2021   PLT 263 12/12/2021      Latest Ref Rng & Units 12/12/2021    5:34 AM  CMP  Glucose 70 - 99 mg/dL 83   BUN 6 - 20 mg/dL 9   Creatinine 0.44 - 1.00 mg/dL 1.02   Sodium 135 - 145 mmol/L 137   Potassium 3.5 - 5.1 mmol/L 4.2   Chloride 98 - 111 mmol/L 106   CO2 22 - 32 mmol/L 24   Calcium 8.9 - 10.3 mg/dL 9.0   Total Protein 6.5 - 8.1 g/dL 5.2   Total Bilirubin 0.3 - 1.2 mg/dL 0.8   Alkaline Phos 38 - 126 U/L 103   AST 15 - 41 U/L 35   ALT 0 - 44 U/L 19    Edinburgh Score:    12/12/2021   12:57 PM  Edinburgh Postnatal Depression Scale Screening Tool  I  have been able to laugh and see the funny side of things. 0  I have looked forward with enjoyment to things. 0  I have blamed myself unnecessarily when things went wrong. 1  I have been anxious or worried for no good reason. 0  I have felt scared or panicky for no good reason. 0  Things have been getting on top of me. 1  I have been so unhappy that I have had difficulty sleeping. 0  I have felt sad or miserable. 1  I have been so unhappy that I have been crying. 1  The thought of harming myself has occurred to me. 0  Edinburgh Postnatal Depression Scale Total 4      After visit meds:  Allergies as of 12/13/2021       Reactions   Codeine Other (See Comments)   Family allergy        Medication List     STOP taking these medications    acetaminophen 500 MG tablet Commonly known as: TYLENOL   omeprazole 20 MG capsule Commonly known as: PRILOSEC   ondansetron 4 MG disintegrating tablet Commonly known as: ZOFRAN-ODT   promethazine 25 MG tablet Commonly known as: PHENERGAN       TAKE these medications    ibuprofen 600 MG tablet Commonly known as: ADVIL Take 1  tablet (600 mg total) by mouth every 6 (six) hours.   oxyCODONE 5 MG immediate release tablet Commonly known as: Oxy IR/ROXICODONE Take 1-2 tablets (5-10 mg total) by mouth every 4 (four) hours as needed for moderate pain.   pantoprazole 40 MG tablet Commonly known as: PROTONIX Take 40 mg by mouth daily.   Prenatal Vitamins 28-0.8 MG Tabs Take 1 tablet by mouth daily.       Recommend iron otc q day  Discharge home in stable condition Infant Feeding: Breast Infant Disposition:home with mother Discharge instruction: per After Visit Summary and Postpartum booklet. Activity: Advance as tolerated. Pelvic rest for 6 weeks.  Diet: routine diet Anticipated Birth Control: Unsure Postpartum Appointment:6 weeks Additional Postpartum F/U:  none Future Appointments:No future appointments. Follow up Visit:      12/13/2021 Charyl Bigger, MD

## 2021-12-13 NOTE — Lactation Note (Signed)
This note was copied from a baby's chart. Lactation Consultation Note  Patient Name: Emily Pham Today's Date: 12/13/2021 Reason for consult: Mother's request;Follow-up assessment;Term;[place] Age:40 hours  Infant has been predominantly formula-fed since 6 hrs of life. Mom has not pumped since beginning formula (she has used her hand pump once).  Mom wanted assistance with latch. Infant was difficult to latch b/c of fussiness, but eventually settled down with position change. The importance of pumping was explained & Mom verbalized understanding.   Mom has a pump through her insurance and a Baby Buddha pump. A size 24 flange should be appropriate for her.   Mom knows how to reach [place] for post-d/c questions & is also aware of [employer].    Maternal Data Has patient been taught Hand Expression?: Yes Does the patient have breastfeeding experience prior to this delivery?: No  Feeding Mother's Current Feeding Choice: Breast Milk and Formula Nipple Type: Extra Slow Flow    Interventions Interventions: Education;Assisted with latch  Discharge Pump: Personal (Baby Buddha pump)  Consult Status Consult Status: Complete    Larkin Ina 12/13/2021, 2:57 PM

## 2021-12-22 ENCOUNTER — Telehealth (HOSPITAL_COMMUNITY): Payer: Self-pay | Admitting: *Deleted

## 2021-12-22 NOTE — Telephone Encounter (Signed)
Hospital Discharge Follow-Up Call:  Patient reports that she is well overall.  She is still having some incisional soreness usually relieved Tylenol and/or ibuprofen.  She is still taking oxycodone occasionally, but not daily anymore.  She does feel like the soreness is getting better daily.  Asked about steri-strips and when to removed them.  Her OB had said to remove them on day 14 if they have not come off before that.  Encouraged her to follow her OB's instructions.  EPDS today was 5 and she endorses some symptoms of Baby Blues (occasional tearfulness and overwhelm feeling), but overall she says she feels well emotionally.  We discussed Baby Blues vs. PMAD and when to reach out to her OB/CNM regarding mental health concerns.  Patient says that baby is well and she has no concerns about baby's health.  Baby is getting EBM and formula.  Patient would like to exclusively pump and is concerned about her milk supply.  She also says that pumping "just doesn't feel quite right".  Encouraged her to see Outpatient LC and gave her the phone number to make an appointment.  Talked generally about pumping at least 8x/24 hr and staying well hydrated, but also that an [place] could likely help in making pumping more comfortable and assist with making a feeding/pumping plan that will work for family.  She reports that baby sleeps in a bassinet in parent's room.  ABCs of Safe Sleep reviewed.

## 2022-01-01 ENCOUNTER — Telehealth: Payer: Self-pay | Admitting: Hematology and Oncology

## 2022-01-01 NOTE — Telephone Encounter (Signed)
Scheduled appt per 10/5 referral. Pt is aware of appt date and time. Pt is aware to arrive 15 mins prior to appt time and to bring and updated insurance card. Pt is aware of appt location.   

## 2022-01-23 ENCOUNTER — Inpatient Hospital Stay: Payer: BC Managed Care – PPO | Admitting: Hematology and Oncology

## 2022-01-23 ENCOUNTER — Inpatient Hospital Stay: Payer: BC Managed Care – PPO

## 2022-05-25 ENCOUNTER — Encounter: Payer: Self-pay | Admitting: Obstetrics

## 2022-06-16 ENCOUNTER — Encounter: Payer: Self-pay | Admitting: Obstetrics

## 2022-06-16 ENCOUNTER — Other Ambulatory Visit: Payer: Self-pay | Admitting: Obstetrics

## 2022-06-16 DIAGNOSIS — K429 Umbilical hernia without obstruction or gangrene: Secondary | ICD-10-CM

## 2022-06-17 ENCOUNTER — Other Ambulatory Visit: Payer: BC Managed Care – PPO

## 2022-08-26 ENCOUNTER — Encounter (HOSPITAL_COMMUNITY): Payer: Self-pay

## 2022-08-26 ENCOUNTER — Ambulatory Visit: Payer: Self-pay | Admitting: Surgery

## 2022-09-07 NOTE — Progress Notes (Addendum)
COVID Vaccine received:  []  No [x]  Yes Date of any COVID positive Test in last 90 days: none  PCP - Abbe Amsterdam, MD Cardiologist - none  Chest x-ray -  EKG -  will do at PST Stress Test -  ECHO -  Cardiac Cath -   PCR screen: []  Ordered & Completed           []   No Order but Needs PROFEND           [x]   N/A for this surgery  Surgery Plan:  [x]  Ambulatory                            []  Outpatient in bed                            []  Admit  Anesthesia:    [x]  General  []  Spinal                           []   Choice []   MAC  Bowel Prep - [x]  No  []   Yes ______  Pacemaker / ICD device [x]  No []  Yes   Spinal Cord Stimulator:[x]  No []  Yes       History of Sleep Apnea? [x]  No []  Yes   CPAP used?- [x]  No []  Yes    Does the patient monitor blood sugar?          []  No []  Yes  [x]  N/A  Patient has: [x]  NO Hx DM   []  Pre-DM                 []  DM1  []   DM2  Blood Thinner / Instructions:None Aspirin Instructions:  None  ERAS Protocol Ordered: [x]  No  []  Yes PRE-SURGERY []  ENSURE  []  G2  [x]  No Drink Ordered Patient is to be NPO after: 11:00 am  Activity level: Patient is able to climb a flight of stairs without difficulty; [x]  No CP  [x]  No SOB, Patient can perform ADLs without assistance.   Anesthesia review: HTN, anxiety, GERD EKG Reading: Normal sinus rhythm with sinus arrhythmia Low voltage QRS Incomplete right bundle branch block Confirmed by Steffanie Dunn [phone]) on 09/10/2022 9:18:26 PM  Patient denies shortness of breath, fever, cough and chest pain at PAT appointment.  Patient verbalized understanding and agreement to the Pre-Surgical Instructions that were given to them at this PAT appointment. Patient was also educated of the need to review these PAT instructions again prior to her surgery.I reviewed the appropriate phone numbers to call if they have any and questions or concerns.

## 2022-09-07 NOTE — Patient Instructions (Signed)
SURGICAL WAITING ROOM VISITATION Patients having surgery or a procedure may have no more than 2 support people in the waiting area - these visitors may rotate in the visitor waiting room.   Due to an increase in RSV and influenza rates and associated hospitalizations, children ages 24 and under may not visit patients in [facility] hospitals. If the patient needs to stay at the hospital during part of their recovery, the visitor guidelines for inpatient rooms apply.  PRE-OP VISITATION  Pre-op nurse will coordinate an appropriate time for 1 support person to accompany the patient in pre-op.  This support person may not rotate.  This visitor will be contacted when the time is appropriate for the visitor to come back in the pre-op area.  Please refer to the [facility] website for the visitor guidelines for Inpatients (after your surgery is over and you are in a regular room).  You are not required to quarantine at this time prior to your surgery. However, you must do this: Hand Hygiene often Do NOT share personal items Notify your provider if you are in close contact with someone who has COVID or you develop fever 100.4 or greater, new onset of sneezing, cough, sore throat, shortness of breath or body aches.  If you test positive for Covid or have been in contact with anyone that has tested positive in the last 10 days please notify you surgeon.    Your procedure is scheduled on:  Tuesday  September 22, 2022  Report to [facility] Main Entrance: [place] entrance where the [employer] is available.   Report to admitting at: 11:45    AM  +++++Call this number if you have any questions or problems the morning of surgery [phone]  Do not eat food after Midnight the night prior to your surgery/procedure.  After Midnight you may have the following liquids until   11:00   AM DAY OF SURGERY  Clear Liquid Diet Water Black Coffee (sugar ok, NO MILK/CREAM OR CREAMERS)  Tea (sugar ok, NO  MILK/CREAM OR CREAMERS) regular and decaf                             Plain Jell-O  with no fruit (NO RED)                                           Fruit ices (not with fruit pulp, NO RED)                                     Popsicles (NO RED)                                                                  Juice: apple, WHITE grape, WHITE cranberry Sports drinks like Gatorade or Powerade (NO RED)               FOLLOW BOWEL PREP AND ANY ADDITIONAL PRE OP INSTRUCTIONS YOU RECEIVED FROM YOUR SURGEON'S OFFICE!!!   Oral Hygiene is also important to reduce your  risk of infection.        Remember - BRUSH YOUR TEETH THE MORNING OF SURGERY WITH YOUR REGULAR TOOTHPASTE  Do NOT smoke after Midnight the night before surgery.  Take ONLY these medicines the morning of surgery with A SIP OF WATER: Pantoprazole (Protonix),   ???                    You may not have any metal on your body including hair pins, jewelry, and body piercing  Do not wear make-up, lotions, powders, perfumes or deodorant  Do not wear nail polish including gel and S&S, artificial / acrylic nails, or any other type of covering on natural nails including finger and toenails. If you have artificial nails, gel coating, etc., that needs to be removed by a nail salon, Please have this removed prior to surgery. Not doing so may mean that your surgery could be cancelled or delayed if the Surgeon or anesthesia staff feels like they are unable to monitor you safely.   Do not shave 48 hours prior to surgery to avoid nicks in your skin which may contribute to postoperative infections.   Contacts, Hearing Aids, dentures or bridgework may not be worn into surgery. DENTURES WILL BE REMOVED PRIOR TO SURGERY PLEASE DO NOT APPLY "Poly grip" OR ADHESIVES!!!  Patients discharged on the day of surgery will not be allowed to drive home.  Someone NEEDS to stay with you for the first 24 hours after anesthesia.  Do not bring your home medications to  the hospital. The Pharmacy will dispense medications listed on your medication list to you during your admission in the Hospital.   Please read over the following fact sheets you were given: IF YOU HAVE QUESTIONS ABOUT YOUR PRE-OP INSTRUCTIONS, PLEASE CALL [phone].   [facility] - Preparing for Surgery Before surgery, you can play an important role.  Because skin is not sterile, your skin needs to be as free of germs as possible.  You can reduce the number of germs on your skin by washing with CHG (chlorahexidine gluconate) soap before surgery.  CHG is an antiseptic cleaner which kills germs and bonds with the skin to continue killing germs even after washing. Please DO NOT use if you have an allergy to CHG or antibacterial soaps.  If your skin becomes reddened/irritated stop using the CHG and inform your nurse when you arrive at Short Stay. Do not shave (including legs and underarms) for at least 48 hours prior to the first CHG shower.  You may shave your face/neck.  Please follow these instructions carefully:  1.  Shower with CHG Soap the night before surgery and the  morning of surgery.  2.  If you choose to wash your hair, wash your hair first as usual with your normal  shampoo.  3.  After you shampoo, rinse your hair and body thoroughly to remove the shampoo.                             4.  Use CHG as you would any other liquid soap.  You can apply chg directly to the skin and wash.  Gently with a scrungie or clean washcloth.  5.  Apply the CHG Soap to your body ONLY FROM THE NECK DOWN.   Do not use on face/ open  Wound or open sores. Avoid contact with eyes, ears mouth and genitals (private parts).                       Wash face,  Genitals (private parts) with your normal soap.             6.  Wash thoroughly, paying special attention to the area where your  surgery  will be performed.  7.  Thoroughly rinse your body with warm water from the neck down.  8.   DO NOT shower/wash with your normal soap after using and rinsing off the CHG Soap.            9.  Pat yourself dry with a clean towel.            10.  Wear clean pajamas.            11.  Place clean sheets on your bed the night of your first shower and do not  sleep with pets.  ON THE DAY OF SURGERY : Do not apply any lotions/deodorants the morning of surgery.  Please wear clean clothes to the hospital/surgery center.    FAILURE TO FOLLOW THESE INSTRUCTIONS MAY RESULT IN THE CANCELLATION OF YOUR SURGERY  PATIENT SIGNATURE_________________________________  NURSE SIGNATURE__________________________________  ________________________________________________________________________

## 2022-09-10 ENCOUNTER — Other Ambulatory Visit: Payer: Self-pay

## 2022-09-10 ENCOUNTER — Encounter (HOSPITAL_COMMUNITY)
Admission: RE | Admit: 2022-09-10 | Discharge: 2022-09-10 | Disposition: A | Discharge status: Home / Self Care | Payer: BC Managed Care – PPO | Source: Ambulatory Visit | Attending: Surgery | Admitting: Surgery

## 2022-09-10 ENCOUNTER — Encounter (HOSPITAL_COMMUNITY): Payer: Self-pay

## 2022-09-10 VITALS — BP 138/80 | HR 60 | Temp 99.0°F | Resp 18 | Ht 63.0 in | Wt 206.0 lb

## 2022-09-10 DIAGNOSIS — Z01818 Encounter for other preprocedural examination: Secondary | ICD-10-CM | POA: Insufficient documentation

## 2022-09-10 DIAGNOSIS — I10 Essential (primary) hypertension: Secondary | ICD-10-CM | POA: Diagnosis not present

## 2022-09-10 HISTORY — DX: Gastro-esophageal reflux disease without esophagitis: K21.9

## 2022-09-10 HISTORY — DX: Anxiety disorder, unspecified: F41.9

## 2022-09-10 LAB — CBC
HCT: 38.6 % (ref 36.0–46.0)
Hemoglobin: 12.2 g/dL (ref 12.0–15.0)
MCH: 27.9 pg (ref 26.0–34.0)
MCHC: 31.6 g/dL (ref 30.0–36.0)
MCV: 88.3 fL (ref 80.0–100.0)
Platelets: 377 10*3/uL (ref 150–400)
RBC: 4.37 MIL/uL (ref 3.87–5.11)
RDW: 15.1 % (ref 11.5–15.5)
WBC: 5.4 10*3/uL (ref 4.0–10.5)
nRBC: 0 % (ref 0.0–0.2)

## 2022-09-10 LAB — BASIC METABOLIC PANEL
Anion gap: 6 (ref 5–15)
BUN: 9 mg/dL (ref 6–20)
CO2: 26 mmol/L (ref 22–32)
Calcium: 8.9 mg/dL (ref 8.9–10.3)
Chloride: 107 mmol/L (ref 98–111)
Creatinine, Ser: 0.79 mg/dL (ref 0.44–1.00)
GFR, Estimated: >60 mL/min (ref >60)
Glucose, Bld: 79 mg/dL (ref 70–99)
Potassium: 3.9 mmol/L (ref 3.5–5.1)
Sodium: 139 mmol/L (ref 135–145)

## 2022-09-22 ENCOUNTER — Other Ambulatory Visit: Payer: Self-pay

## 2022-09-22 ENCOUNTER — Encounter (HOSPITAL_COMMUNITY): Payer: Self-pay | Admitting: Surgery

## 2022-09-22 ENCOUNTER — Ambulatory Visit (HOSPITAL_COMMUNITY): Payer: BC Managed Care – PPO | Admitting: Anesthesiology

## 2022-09-22 ENCOUNTER — Ambulatory Visit (HOSPITAL_COMMUNITY)
Admission: RE | Admit: 2022-09-22 | Discharge: 2022-09-23 | Disposition: A | Discharge status: Home / Self Care | Payer: BC Managed Care – PPO | Attending: Surgery | Admitting: Surgery

## 2022-09-22 ENCOUNTER — Encounter (HOSPITAL_COMMUNITY)
Admission: RE | Disposition: A | Discharge status: Home / Self Care | Payer: Self-pay | Source: Home / Self Care | Attending: Surgery

## 2022-09-22 ENCOUNTER — Ambulatory Visit (HOSPITAL_COMMUNITY): Payer: BC Managed Care – PPO | Admitting: Physician Assistant

## 2022-09-22 DIAGNOSIS — Z01818 Encounter for other preprocedural examination: Secondary | ICD-10-CM

## 2022-09-22 DIAGNOSIS — I10 Essential (primary) hypertension: Secondary | ICD-10-CM | POA: Insufficient documentation

## 2022-09-22 DIAGNOSIS — K219 Gastro-esophageal reflux disease without esophagitis: Secondary | ICD-10-CM | POA: Insufficient documentation

## 2022-09-22 DIAGNOSIS — Z8249 Family history of ischemic heart disease and other diseases of the circulatory system: Secondary | ICD-10-CM | POA: Insufficient documentation

## 2022-09-22 DIAGNOSIS — K432 Incisional hernia without obstruction or gangrene: Secondary | ICD-10-CM | POA: Diagnosis present

## 2022-09-22 HISTORY — PX: XI ROBOTIC ASSISTED VENTRAL HERNIA: SHX6789

## 2022-09-22 LAB — POCT PREGNANCY, URINE: Preg Test, Ur: NEGATIVE

## 2022-09-22 LAB — CBC
HCT: 41.3 % (ref 36.0–46.0)
Hemoglobin: 13.2 g/dL (ref 12.0–15.0)
MCH: 28.3 pg (ref 26.0–34.0)
MCHC: 32 g/dL (ref 30.0–36.0)
MCV: 88.6 fL (ref 80.0–100.0)
Platelets: 356 10*3/uL (ref 150–400)
RBC: 4.66 MIL/uL (ref 3.87–5.11)
RDW: 14.8 % (ref 11.5–15.5)
WBC: 14.3 10*3/uL — ABNORMAL HIGH (ref 4.0–10.5)
nRBC: 0 % (ref 0.0–0.2)

## 2022-09-22 LAB — CREATININE, SERUM
Creatinine, Ser: 0.74 mg/dL (ref 0.44–1.00)
GFR, Estimated: >60 mL/min (ref >60)

## 2022-09-22 SURGERY — REPAIR, HERNIA, VENTRAL, ROBOT-ASSISTED
Anesthesia: General

## 2022-09-22 MED ORDER — ONDANSETRON HCL 4 MG/2ML IJ SOLN
INTRAMUSCULAR | Status: DC | PRN
  Administered 2022-09-22: 4 mg via INTRAVENOUS

## 2022-09-22 MED ORDER — SIMETHICONE 80 MG PO CHEW: 40.0000 mg | CHEWABLE_TABLET | Freq: Four times a day (QID) | ORAL | Status: DC | PRN

## 2022-09-22 MED ORDER — FENTANYL CITRATE (PF) 250 MCG/5ML IJ SOLN
INTRAMUSCULAR | Status: DC | PRN
  Administered 2022-09-22 (×5): 50 ug via INTRAVENOUS
  Administered 2022-09-22: 100 ug via INTRAVENOUS

## 2022-09-22 MED ORDER — 0.9 % SODIUM CHLORIDE (POUR BTL) OPTIME
TOPICAL | Status: DC | PRN
  Administered 2022-09-22: 1000 mL

## 2022-09-22 MED ORDER — DOCUSATE SODIUM 100 MG PO CAPS
100.0000 mg | ORAL_CAPSULE | Freq: Two times a day (BID) | ORAL | Status: DC
  Administered 2022-09-22 – 2022-09-23 (×2): 100 mg via ORAL
  Filled 2022-09-22 (×2): qty 1

## 2022-09-22 MED ORDER — ROCURONIUM BROMIDE 10 MG/ML (PF) SYRINGE
PREFILLED_SYRINGE | INTRAVENOUS | Status: DC | PRN
  Administered 2022-09-22: 60 mg via INTRAVENOUS
  Administered 2022-09-22 (×2): 20 mg via INTRAVENOUS

## 2022-09-22 MED ORDER — MIDAZOLAM HCL 2 MG/2ML IJ SOLN
INTRAMUSCULAR | Status: DC | PRN
  Administered 2022-09-22: 2 mg via INTRAVENOUS

## 2022-09-22 MED ORDER — LACTATED RINGERS IV SOLN: INTRAVENOUS | Status: DC

## 2022-09-22 MED ORDER — CHLORHEXIDINE GLUCONATE CLOTH 2 % EX PADS: 6.0000 | MEDICATED_PAD | Freq: Once | CUTANEOUS | Status: DC

## 2022-09-22 MED ORDER — LIDOCAINE HCL (PF) 2 % IJ SOLN
INTRAMUSCULAR | Status: AC
  Filled 2022-09-22: qty 5

## 2022-09-22 MED ORDER — ACETAMINOPHEN 500 MG PO TABS
1000.0000 mg | ORAL_TABLET | ORAL | Status: AC
  Administered 2022-09-22: 1000 mg via ORAL
  Filled 2022-09-22: qty 2

## 2022-09-22 MED ORDER — DEXAMETHASONE SODIUM PHOSPHATE 10 MG/ML IJ SOLN
INTRAMUSCULAR | Status: AC
  Filled 2022-09-22: qty 1

## 2022-09-22 MED ORDER — BUPIVACAINE LIPOSOME 1.3 % IJ SUSP
INTRAMUSCULAR | Status: DC | PRN
  Administered 2022-09-22: 20 mL

## 2022-09-22 MED ORDER — ROCURONIUM BROMIDE 10 MG/ML (PF) SYRINGE
PREFILLED_SYRINGE | INTRAVENOUS | Status: AC
  Filled 2022-09-22: qty 10

## 2022-09-22 MED ORDER — PROCHLORPERAZINE EDISYLATE 10 MG/2ML IJ SOLN
10.0000 mg | INTRAMUSCULAR | Status: DC | PRN
  Administered 2022-09-22: 10 mg via INTRAVENOUS
  Filled 2022-09-22: qty 2

## 2022-09-22 MED ORDER — METHOCARBAMOL 1000 MG/10ML IJ SOLN: 500.0000 mg | Freq: Four times a day (QID) | INTRAVENOUS | Status: DC | PRN

## 2022-09-22 MED ORDER — SIMETHICONE 80 MG PO CHEW: 80.0000 mg | CHEWABLE_TABLET | Freq: Four times a day (QID) | ORAL | Status: DC | PRN

## 2022-09-22 MED ORDER — SUGAMMADEX SODIUM 200 MG/2ML IV SOLN
INTRAVENOUS | Status: DC | PRN
  Administered 2022-09-22: 200 mg via INTRAVENOUS

## 2022-09-22 MED ORDER — FENTANYL CITRATE PF 50 MCG/ML IJ SOSY
PREFILLED_SYRINGE | INTRAMUSCULAR | Status: AC
  Filled 2022-09-22: qty 2

## 2022-09-22 MED ORDER — ENOXAPARIN SODIUM 40 MG/0.4ML IJ SOSY
40.0000 mg | PREFILLED_SYRINGE | Freq: Once | INTRAMUSCULAR | Status: AC
  Administered 2022-09-22: 40 mg via SUBCUTANEOUS
  Filled 2022-09-22: qty 0.4

## 2022-09-22 MED ORDER — LIDOCAINE HCL (PF) 2 % IJ SOLN
INTRAMUSCULAR | Status: AC
  Filled 2022-09-22: qty 10

## 2022-09-22 MED ORDER — FENTANYL CITRATE (PF) 250 MCG/5ML IJ SOLN
INTRAMUSCULAR | Status: AC
  Filled 2022-09-22: qty 5

## 2022-09-22 MED ORDER — ONDANSETRON HCL 4 MG/2ML IJ SOLN
4.0000 mg | Freq: Four times a day (QID) | INTRAMUSCULAR | Status: DC | PRN
  Administered 2022-09-22: 4 mg via INTRAVENOUS
  Filled 2022-09-22 (×2): qty 2

## 2022-09-22 MED ORDER — MIDAZOLAM HCL 2 MG/2ML IJ SOLN
INTRAMUSCULAR | Status: AC
  Filled 2022-09-22: qty 2

## 2022-09-22 MED ORDER — PROPOFOL 10 MG/ML IV BOLUS
INTRAVENOUS | Status: AC
  Filled 2022-09-22: qty 20

## 2022-09-22 MED ORDER — AMISULPRIDE (ANTIEMETIC) 5 MG/2ML IV SOLN: 10.0000 mg | Freq: Once | INTRAVENOUS | Status: DC | PRN

## 2022-09-22 MED ORDER — KETAMINE HCL 50 MG/5ML IJ SOSY
PREFILLED_SYRINGE | INTRAMUSCULAR | Status: AC
  Filled 2022-09-22: qty 5

## 2022-09-22 MED ORDER — ENOXAPARIN SODIUM 40 MG/0.4ML IJ SOSY
40.0000 mg | PREFILLED_SYRINGE | INTRAMUSCULAR | Status: DC
  Administered 2022-09-23: 40 mg via SUBCUTANEOUS
  Filled 2022-09-22: qty 0.4

## 2022-09-22 MED ORDER — DEXAMETHASONE SODIUM PHOSPHATE 10 MG/ML IJ SOLN
INTRAMUSCULAR | Status: DC | PRN
  Administered 2022-09-22: 10 mg via INTRAVENOUS

## 2022-09-22 MED ORDER — BUPIVACAINE LIPOSOME 1.3 % IJ SUSP: 20.0000 mL | Freq: Once | INTRAMUSCULAR | Status: DC

## 2022-09-22 MED ORDER — CEFAZOLIN SODIUM-DEXTROSE 2-4 GM/100ML-% IV SOLN
2.0000 g | INTRAVENOUS | Status: AC
  Administered 2022-09-22: 2 g via INTRAVENOUS
  Filled 2022-09-22: qty 100

## 2022-09-22 MED ORDER — BUPIVACAINE-EPINEPHRINE 0.25% -1:200000 IJ SOLN
INTRAMUSCULAR | Status: DC | PRN
  Administered 2022-09-22: 30 mL

## 2022-09-22 MED ORDER — OXYCODONE HCL 5 MG PO TABS: 5.0000 mg | ORAL_TABLET | ORAL | Status: DC | PRN

## 2022-09-22 MED ORDER — TRIAMCINOLONE ACETONIDE 40 MG/ML IJ SUSP
INTRAMUSCULAR | Status: AC
  Filled 2022-09-22: qty 1

## 2022-09-22 MED ORDER — ORAL CARE MOUTH RINSE: 15.0000 mL | Freq: Once | OROMUCOSAL | Status: AC

## 2022-09-22 MED ORDER — GABAPENTIN 300 MG PO CAPS
300.0000 mg | ORAL_CAPSULE | ORAL | Status: AC
  Administered 2022-09-22: 300 mg via ORAL
  Filled 2022-09-22: qty 1

## 2022-09-22 MED ORDER — CHLORHEXIDINE GLUCONATE 0.12 % MT SOLN
15.0000 mL | Freq: Once | OROMUCOSAL | Status: AC
  Administered 2022-09-22: 15 mL via OROMUCOSAL

## 2022-09-22 MED ORDER — KETAMINE HCL 10 MG/ML IJ SOLN
INTRAMUSCULAR | Status: DC | PRN
  Administered 2022-09-22: 30 mg via INTRAVENOUS
  Administered 2022-09-22 (×2): 10 mg via INTRAVENOUS

## 2022-09-22 MED ORDER — KETOROLAC TROMETHAMINE 15 MG/ML IJ SOLN: 15.0000 mg | INTRAMUSCULAR | Status: DC

## 2022-09-22 MED ORDER — HYDROMORPHONE HCL 1 MG/ML IJ SOLN
1.0000 mg | INTRAMUSCULAR | Status: DC | PRN
  Administered 2022-09-22 (×2): 1 mg via INTRAVENOUS
  Filled 2022-09-22 (×2): qty 1

## 2022-09-22 MED ORDER — PROPOFOL 10 MG/ML IV BOLUS
INTRAVENOUS | Status: DC | PRN
  Administered 2022-09-22: 200 mg via INTRAVENOUS

## 2022-09-22 MED ORDER — ONDANSETRON HCL 4 MG/2ML IJ SOLN
INTRAMUSCULAR | Status: AC
  Filled 2022-09-22: qty 2

## 2022-09-22 MED ORDER — TRIAMCINOLONE ACETONIDE 40 MG/ML IJ SUSP
40.0000 mg | Freq: Once | INTRAMUSCULAR | Status: AC
  Administered 2022-09-22: 40 mg via INTRADERMAL

## 2022-09-22 MED ORDER — FENTANYL CITRATE (PF) 100 MCG/2ML IJ SOLN
INTRAMUSCULAR | Status: AC
  Filled 2022-09-22: qty 2

## 2022-09-22 MED ORDER — HYDRALAZINE HCL 20 MG/ML IJ SOLN
INTRAMUSCULAR | Status: DC | PRN
  Administered 2022-09-22 (×2): 4 mg via INTRAVENOUS

## 2022-09-22 MED ORDER — HYDRALAZINE HCL 20 MG/ML IJ SOLN
INTRAMUSCULAR | Status: AC
  Filled 2022-09-22: qty 1

## 2022-09-22 MED ORDER — OXYCODONE HCL 5 MG PO TABS
10.0000 mg | ORAL_TABLET | ORAL | Status: DC | PRN
  Administered 2022-09-22 – 2022-09-23 (×2): 10 mg via ORAL
  Filled 2022-09-22 (×2): qty 2

## 2022-09-22 MED ORDER — FENTANYL CITRATE PF 50 MCG/ML IJ SOSY
25.0000 ug | PREFILLED_SYRINGE | INTRAMUSCULAR | Status: DC | PRN
  Administered 2022-09-22: 50 ug via INTRAVENOUS

## 2022-09-22 MED ORDER — ACETAMINOPHEN 325 MG PO TABS
650.0000 mg | ORAL_TABLET | Freq: Four times a day (QID) | ORAL | Status: DC
  Administered 2022-09-22 – 2022-09-23 (×3): 650 mg via ORAL
  Filled 2022-09-22 (×3): qty 2

## 2022-09-22 MED ORDER — LIDOCAINE 2% (20 MG/ML) 5 ML SYRINGE
INTRAMUSCULAR | Status: DC | PRN
  Administered 2022-09-22: 1.5 mg/kg/h via INTRAVENOUS
  Administered 2022-09-22: 60 mg via INTRAVENOUS

## 2022-09-22 MED ORDER — PANTOPRAZOLE SODIUM 40 MG PO TBEC
40.0000 mg | DELAYED_RELEASE_TABLET | Freq: Every day | ORAL | Status: DC
  Administered 2022-09-23: 40 mg via ORAL
  Filled 2022-09-22: qty 1

## 2022-09-22 SURGICAL SUPPLY — 69 items
ADH SKN CLS APL DERMABOND .7 (GAUZE/BANDAGES/DRESSINGS) ×1
ANTIFOG SOL W/FOAM PAD STRL (MISCELLANEOUS) ×1
APL PRP STRL LF DISP 70% ISPRP (MISCELLANEOUS) ×1
BAG COUNTER SPONGE SURGICOUNT (BAG) ×2 IMPLANT
BAG SPNG CNTER NS LX DISP (BAG) ×1
BLADE SURG SZ11 CARB STEEL (BLADE) ×2 IMPLANT
CHLORAPREP W/TINT 26 (MISCELLANEOUS) ×2 IMPLANT
COVER MAYO STAND STRL (DRAPES) ×2 IMPLANT
COVER TIP SHEARS 8 DVNC (MISCELLANEOUS) ×2 IMPLANT
DERMABOND ADVANCED .7 DNX12 (GAUZE/BANDAGES/DRESSINGS) IMPLANT
DEVICE TROCAR PUNCTURE CLOSURE (ENDOMECHANICALS) IMPLANT
DRAPE ARM DVNC X/XI (DISPOSABLE) ×6 IMPLANT
DRAPE COLUMN DVNC XI (DISPOSABLE) ×2 IMPLANT
DRIVER NDL LRG 8 DVNC XI (INSTRUMENTS) ×4 IMPLANT
DRIVER NDL MEGA SUTCUT DVNCXI (INSTRUMENTS) ×4 IMPLANT
DRIVER NDLE LRG 8 DVNC XI (INSTRUMENTS) IMPLANT
DRIVER NDLE MEGA SUTCUT DVNCXI (INSTRUMENTS) ×1 IMPLANT
ELECT L-HOOK LAP 45CM DISP (ELECTROSURGICAL) ×1
ELECT PENCIL ROCKER SW 15FT (MISCELLANEOUS) ×2 IMPLANT
ELECT REM PT RETURN 15FT ADLT (MISCELLANEOUS) ×2 IMPLANT
ELECTRODE L-HOOK LAP 45CM DISP (ELECTROSURGICAL) ×2 IMPLANT
FORCEPS PROGRASP DVNC XI (FORCEP) ×2 IMPLANT
GLOVE BIO SURGEON STRL SZ7.5 (GLOVE) ×4 IMPLANT
GLOVE BIOGEL PI IND STRL 8 (GLOVE) ×4 IMPLANT
GOWN SRG XL LVL 4 BRTHBL STRL (GOWNS) IMPLANT
GOWN STRL NON-REIN XL LVL4 (GOWNS) ×2
GOWN STRL REUS W/ TWL XL LVL3 (GOWN DISPOSABLE) ×6 IMPLANT
GOWN STRL REUS W/TWL XL LVL3 (GOWN DISPOSABLE) ×3
GRASPER SUT TROCAR 14GX15 (MISCELLANEOUS) IMPLANT
GRASPER TIP-UP FEN DVNC XI (INSTRUMENTS) ×2 IMPLANT
IRRIG SUCT STRYKERFLOW 2 WTIP (MISCELLANEOUS)
IRRIGATION SUCT STRKRFLW 2 WTP (MISCELLANEOUS) IMPLANT
KIT BASIN OR (CUSTOM PROCEDURE TRAY) ×2 IMPLANT
KIT TURNOVER KIT A (KITS) IMPLANT
MANIFOLD NEPTUNE II (INSTRUMENTS) ×2 IMPLANT
MARKER SKIN DUAL TIP RULER LAB (MISCELLANEOUS) IMPLANT
MESH SOFT 12X12IN BARD (Mesh General) IMPLANT
NDL SPNL 18GX3.5 QUINCKE PK (NEEDLE) ×2 IMPLANT
NEEDLE SPNL 18GX3.5 QUINCKE PK (NEEDLE) ×1 IMPLANT
OBTURATOR OPTICAL STND 8 DVNC (TROCAR) ×1
OBTURATOR OPTICALSTD 8 DVNC (TROCAR) IMPLANT
PACK CARDIOVASCULAR III (CUSTOM PROCEDURE TRAY) ×2 IMPLANT
SCISSORS MNPLR CVD DVNC XI (INSTRUMENTS) ×2 IMPLANT
SEAL UNIV 5-12 XI (MISCELLANEOUS) ×6 IMPLANT
SET TUBE SMOKE EVAC HIGH FLOW (TUBING) ×2 IMPLANT
SOL ELECTROSURG ANTI STICK (MISCELLANEOUS) ×1
SOLUTION ANTFG W/FOAM PAD STRL (MISCELLANEOUS) ×2 IMPLANT
SOLUTION ELECTROSURG ANTI STCK (MISCELLANEOUS) ×2 IMPLANT
SPIKE FLUID TRANSFER (MISCELLANEOUS) ×2 IMPLANT
SUT MNCRL AB 4-0 PS2 18 (SUTURE) ×2 IMPLANT
SUT SILK 0 SH 30 (SUTURE) IMPLANT
SUT STRAFIX PDS 18 CTX (SUTURE) IMPLANT
SUT STRAFIX SYMMETRIC 0-0 24 (SUTURE)
SUT STRAFIX SYMMETRIC 1-0 12 (SUTURE)
SUT STRAFIX SYMMETRIC 1-0 24 (SUTURE) ×1
SUT STRTFX SPIRAL PDS+ 2-0 23 (SUTURE)
SUT VLOC 180 0 6IN GS21 (SUTURE) IMPLANT
SUT VLOC 180 0 9IN GS21 (SUTURE) IMPLANT
SUT VLOC 180 2-0 9IN GS21 (SUTURE) IMPLANT
SUTURE STRAFIX SYMMETRC 0-0 24 (SUTURE) IMPLANT
SUTURE STRAFIX SYMMETRC 1-0 12 (SUTURE) IMPLANT
SUTURE STRAFIX SYMMETRC 1-0 24 (SUTURE) IMPLANT
SUTURE STRTFX SPRL PDS+ 2-0 23 (SUTURE) IMPLANT
SYR 20ML LL LF (SYRINGE) ×2 IMPLANT
TAPE STRIPS DRAPE STRL (GAUZE/BANDAGES/DRESSINGS) ×2 IMPLANT
TOWEL OR 17X26 10 PK STRL BLUE (TOWEL DISPOSABLE) ×2 IMPLANT
TOWEL OR NON WOVEN STRL DISP B (DISPOSABLE) IMPLANT
TROCAR ADV FIXATION 12X100MM (TROCAR) ×2 IMPLANT
TROCAR Z-THREAD FIOS 5X100MM (TROCAR) ×2 IMPLANT

## 2022-09-22 NOTE — Op Note (Signed)
Patient: Emily Pham ([birth year], [id number])  Date of Surgery: 09/22/2022   Preoperative Diagnosis: INCISIONAL HERNIA measures 4 cm wide x 7.8 cm tall on preoperative CT  Postoperative Diagnosis: INCISIONAL HERNIA   Surgical Procedure: ROBOTIC INCISIONAL HERNIA REPAIR WITH MESH:  (4 cm wide by 7.8 cm tall) Bilateral posterior rectus myofascial release Bilateral transversus abdominis myofascial release   Operative Team Members:  Surgeon(s) and Role:    * Sirinity Outland, Hyman Hopes, MD - Primary   Anesthesiologist: Atilano Median, DO CRNA: Nelle Don, CRNA; Florene Route, CRNA; Pearson Grippe, CRNA   Anesthesia: General   Fluids:  Total I/O In: 1300 [I.V.:1300] Out: 200 [Urine:175; Blood:25]  Complications: * No complications entered in OR log *  Drains:  None  Specimen: * No specimens in log *   Disposition:  PACU - hemodynamically stable.  Plan of Care: Admit for overnight observation  Indications for Procedure: Emily Pham is a 42 y.o. female who presented with an incisional hernia after C-section via Pfannenstiel incision.  I recommended robotic ventral hernia repair with mesh.  The procedure itself as well as the risks, benefits and alternatives were described.  The risks discussed included but were not limited to the risk of infection, bleeding, damage to nearby structures, recurrent hernia, chronic pain, and mesh complication requiring removal.  After a full discussion and all questions answered, the patient granted consent to proceed.  Findings:  Hernia Location: Ventral hernia location: Umbilical (M3), Infraumbilical (M4), and Suprapubic (M5) Hernia Size:  4 cm wide by 7.8 cm tall  Mesh Size &Type:  29 cm tall x 23 cm wide Bard Soft Mesh Mesh Position: Sublay - Retromuscular Myofascial Releases:  Bilateral posterior rectus myofascial release Bilateral transversus abdominis myofascial release  Description of Procedure: The patient  was positioned supine, moderately flexed at the umbilical level, padded and secured on the operating table.  A timeout procedure was performed.    What is described is a robotic, totally extraperitoneal retromuscular incisional hernia repair with bilateral rectus myofascial release and retromuscular mesh placement.  Laparoscopic Portion: The retrorectus space was entered in the LEFT hypochondrium, at approximately the midclavicular line utilizing a 5 mm optical-viewing trocar.  Upon safe entry into this space, it was insufflated while performing a blunt dissection with the camera still in the optical trocar. A rectus myofascial release was performed on the LEFT side. Dissection was carried out laterally in the retromuscular plane to the edge of the rectus sheath progressively disconnecting the rectus muscle from the underlying posterior rectus sheath. Both the segmental innervation as well as the intercostal artery and vein brances to the rectus muscle were individually preserved.    During the left sided retrorectus dissection, a 12 mm trocar was placed into the lateral most edge of the retrorectus space.  With these initial trocars in position, the medial most aspect of the retrorectus plane was identified, and the posterior sheath was visualized as it inserted on the linea alba. The posterior sheath was incised with cautery entering the preperitoneal plane. A crossover was performed dissecting under the linea alba in the preperitoneal plane until the right rectus sheath was identified.  After identification of the right rectus sheath, it was incised vertically to enter the retrorectus space on the right. A rectus myofascial release was performed on the RIGHT side.  Blunt dissection was carried out laterally in the retromuscular plane to the edge of the rectus sheath progressively disconnecting the rectus muscle from the underlying  posterior rectus sheath. Both the segmental innervation as well as the  intercostal artery and vein brances to the rectus muscle were individually preserved.   At this juncture, both retrorectus planes were initially connected to each other and there was space for further trocar placement. An 8 mm robotic trocar was placed in the midclavicular line in right retrorectus space.  A 8mm robotic trocar was placed within the left rectus musculature in the upper abdomen, and not through the linea alba.  The initial 5 mm access trocar in the midclavicular line within the left retrorectus space was switched out for an 8 mm robotic trocar.   Robotic Portion: The Intuitive daVinci Xi surgical robot was docked in the standard fashion and the procedure begun from the robotic console. A Prograsp instrument and monopolar shears were used for the dissection.  Dissection was carried down inferiorly preserving the peritoneum and the preperitoneal fat in the midline as it was gently dissected off of the overlying linea alba.  On the right side, the posterior rectus sheath was progressively disconnected from its insertion on the linea alba. This allowed for progression of the right side rectus myofascial release.  The rectus myofascial release accomplished medialization of the posterior rectus sheath towards the midline and disinsertion of the rectus muscle from its surrounding fascia, and thus its encasement in the rectus sheath, allowing for widening of the rectus muscle and transfer of the rectus flap towards the midline.  This will allow for future inset of the medial aspect of the flap for abdominal wall reconstruction.  Similarly, on the left side, the posterior rectus sheath was also progressively disconnected from its insertion on the linea alba.  This allowed for progression of the left side rectus myofascial release.  The rectus myofascial release accomplished medialization of the posterior rectus sheath towards the midline and disinsertion of the rectus muscle from its surrounding  fascia, and thus its encasement in the rectus sheath, allowing for widening of the rectus muscle and transfer of the rectus flap towards the midline.  This will allow for future inset of the medial aspect of the flap for abdominal wall reconstruction.  During the dissection of the midline the hernia defect was identified and the hernia sac was not reducible, therefore the hernia peritoneum was incised circumferentially around the edge of the hernia defect which left a defect within the peritoneum in the midline.  This defect was later closed with a running 2-0 v-loc suture.  Both the left and the right rectus myofascial releases were performed towards the lower abdomen, past the arcuate line bilaterally.  During this dissection, the peritoneum and preperitoneal fat in the midline were further preserved below the hernia as they were dissected off of the overlying linea alba.   The peritoneum near the Pfannenstiel incision was of poor quality leaving a large defect in the posterior layer of the abdominal wall reconstruction.  I decided to perform bilateral transversus abdominis release to relax the peritoneum and allow for tension free closure of this defect and recreation of the peritoneal sac.  A transversus abdominis release (TAR) was performed on the right side.  The transversus abdominis muscle was identified deep to the posterior rectus sheath and incised vertically along its entire length, entering the pre-peritoneal or pre-transversalis fascia plane.  This disinserted the transversus abdominis muscle from the linea semilunaris.  Since the intercostal nerves, arteries and veins had been preserved during the rectus myofascial release portion of the procedure, they remained intact during the  TAR. The peritoneum was subsequently peeled away from the underside of the divided transversus abdominis muscle.  This dissection was carried out laterally towards the retroperitoneum.  The TAR accomplished additional  medialization of the posterior rectus sheath with its attached peritoneum towards the midline to allow for visceral sac closure.  The TAR also provided further offset of tension of the rectus muscle flap with additional transfer of the rectus muscle towards the midline, as it remained attached to the external and internal abdominal oblique muscles.  This will allow for future inset of the medial aspect of the flap for abdominal wall reconstruction.   A transversus abdominis release (TAR) was performed on the left side.  The transversus abdominis muscle was identified deep to the posterior rectus sheath and incised vertically along its entire length, entering the pre-peritoneal or pre-transversalis fascia plane.  This disinserted the transversus abdominis muscle from the linea semilunaris.  Since the intercostal nerves, arteries and veins had been preserved during the rectus myofascial release portion of the procedure, they remained intact during the TAR. The peritoneum was subsequently peeled away from the underside of the divided transversus abdominis muscle.  This dissection was carried out laterally towards the retroperitoneum.  The TAR accomplished additional medialization of the posterior rectus sheath with its attached peritoneum towards the midline to allow for visceral sac closure.  The TAR also provided further offset of tension of the rectus muscle flap with additional transfer of the rectus muscle towards the midline, as it remained attached to the external and internal abdominal oblique muscles.  This will allow for future inset of the medial aspect of the flap for abdominal wall reconstruction.    The hernia defect area was now visualized fully.  The hernia defects were located in the Umbilical, infraumbilical and suprapubic regions. Utilizing a metric ruler, the defect are was measured intracorporeally to be 4 cm wide by 7.8 cm tall.  The hernia defect was closed utilizing a continuous, #1  Ethicon Stratafix Symmetric PDS Plus suture.  This suture was run the length of the abdominal wall to also close the rectus diastasis.  The hernia defect, and subsequently the rectus musculature, came together well for a complete abdominal wall reconstruction.  The dissected out retrorectus space was measured with a metric ruler so as to determine the size of the proposed mesh.    The robot was undocked and the laparoscope was inserted, inspecting for hemostasis.  The mesh deployment was performed laparoscopically.  Laparoscopic Portion:  A transversus abdominis plane (TAP) block was performed bilaterally with a mixture of marcaine and Exparel.  The anesthetic was first injected into the plane between the transversus abdominis and internal abdominal oblique muscles on the left. The TAP was repeated on the contralateral side.   A piece of Bard Soft was opened and trimmed to 29 cm tall x 24 cm wide. The mesh was advanced into the retrorectus space and the mesh positioned flat against the intact posterior rectus sheaths. The mesh was not fixated as it occupied the entire retromuscular plane, and also covered all of the trocars.  The trocars were removed and the skin closed with 4-0 Monocryl subcuticular sutures and skin glue.   Ivar Drape, MD General, Bariatric, & Minimally Invasive Surgery [facility], [place]

## 2022-09-22 NOTE — Anesthesia Preprocedure Evaluation (Signed)
Anesthesia Evaluation  Patient identified by MRN, date of birth, ID band Patient awake    Reviewed: Allergy & Precautions, NPO status , Patient's Chart, lab work & pertinent test results  Airway Mallampati: II  TM Distance: >3 FB Neck ROM: Full    Dental no notable dental hx.    Pulmonary neg pulmonary ROS   Pulmonary exam normal        Cardiovascular hypertension,  Rhythm:Regular Rate:Normal     Neuro/Psych   Anxiety     negative neurological ROS     GI/Hepatic Neg liver ROS,GERD  Medicated,,Incisional hernia    Endo/Other  negative endocrine ROS    Renal/GU negative Renal ROS  negative genitourinary   Musculoskeletal negative musculoskeletal ROS (+)    Abdominal Normal abdominal exam  (+)   Peds  Hematology negative hematology ROS (+)   Anesthesia Other Findings   Reproductive/Obstetrics                             Anesthesia Physical Anesthesia Plan  ASA: 2  Anesthesia Plan: General   Post-op Pain Management: Tylenol PO (pre-op)* and Gabapentin PO (pre-op)*   Induction: Intravenous  PONV Risk Score and Plan: 3 and Ondansetron, Dexamethasone, Midazolam and Treatment may vary due to age or medical condition  Airway Management Planned: Mask and Oral ETT  Additional Equipment: None  Intra-op Plan:   Post-operative Plan: Extubation in OR  Informed Consent: I have reviewed the patients History and Physical, chart, labs and discussed the procedure including the risks, benefits and alternatives for the proposed anesthesia with the patient or authorized representative who has indicated his/her understanding and acceptance.     Dental advisory given  Plan Discussed with: CRNA  Anesthesia Plan Comments:        Anesthesia Quick Evaluation

## 2022-09-22 NOTE — Transfer of Care (Signed)
Immediate Anesthesia Transfer of Care Note  Patient: Melvinia D Skibicki  Procedure(s) Performed: ROBOTIC INCISIONAL HERNIA REPAIR WITH MESH  Patient Location: PACU  Anesthesia Type:General  Level of Consciousness: awake, alert , and oriented  Airway & Oxygen Therapy: Patient Spontanous Breathing and Patient connected to face mask oxygen  Post-op Assessment: Report given to RN and Post -op Vital signs reviewed and stable  Post vital signs: Reviewed and stable  Last Vitals:  Vitals Value Taken Time  BP 167/113 09/22/22 1713  Temp 37 C 09/22/22 1713  Pulse 64 09/22/22 1716  Resp 17 09/22/22 1716  SpO2 100 % 09/22/22 1716  Vitals shown include unvalidated device data.  Last Pain:  Vitals:   09/22/22 1713  TempSrc:   PainSc: Asleep         Complications: No notable events documented.

## 2022-09-22 NOTE — Anesthesia Procedure Notes (Signed)
Procedure Name: Intubation Date/Time: 09/22/2022 1:29 PM  Performed by: Florene Route, CRNAPre-anesthesia Checklist: Patient identified, Emergency Drugs available, Suction available and Patient being monitored Patient Re-evaluated:Patient Re-evaluated prior to induction Oxygen Delivery Method: Circle system utilized Preoxygenation: Pre-oxygenation with 100% oxygen Induction Type: IV induction Ventilation: Mask ventilation without difficulty and Oral airway inserted - appropriate to patient size Laryngoscope Size: Hyacinth Meeker and 2 Grade View: Grade II Tube type: Oral Tube size: 7.5 mm Number of attempts: 1 Airway Equipment and Method: Stylet Placement Confirmation: ETT inserted through vocal cords under direct vision, positive ETCO2 and breath sounds checked- equal and bilateral Secured at: 22 cm Tube secured with: Tape Dental Injury: Teeth and Oropharynx as per pre-operative assessment

## 2022-09-22 NOTE — H&P (Signed)
Admitting Physician: Hyman Hopes Risha Barretta  Service: General Surgery  CC: hernia  Subjective   HPI: Emily Pham is an 42 y.o. female who is here for robotic hernia repair  Past Medical History:  Diagnosis Date   Anxiety    GERD (gastroesophageal reflux disease)    History of chicken pox    Hypertension    Vitamin D deficiency     Past Surgical History:  Procedure Laterality Date   CESAREAN SECTION N/A 12/11/2021   Procedure: CESAREAN SECTION;  Surgeon: Noland Fordyce, MD;  Location: [facility] LD ORS;  Service: Obstetrics;  Laterality: N/A;   NO PAST SURGERIES     TONSILLECTOMY      Family History  Problem Relation Age of Onset   Cancer Mother        Breast cancer dx age 69   Arthritis Mother    Diabetes Other        Grandparents   Stroke Other        Grandparents   Arthritis Father    Diabetes Maternal Grandmother    Hypertension Maternal Grandmother     Social:  reports that she has never smoked. She has never used smokeless tobacco. She reports that she does not drink alcohol and does not use drugs.  Allergies:  Allergies  Allergen Reactions   Codeine Other (See Comments)    Family allergy    Medications: Current Outpatient Medications  Medication Instructions   acetaminophen (TYLENOL) 650 mg, Oral, Every 6 hours PRN   Cholecalciferol (VITAMIN D3 PO) 1 tablet, Oral, Daily   ibuprofen (ADVIL) 600 mg, Oral, Every 6 hours   oxyCODONE (OXY IR/ROXICODONE) 5-10 mg, Oral, Every 4 hours PRN   pantoprazole (PROTONIX) 40 mg, Oral, Daily   Prenatal Vit-Fe Fumarate-FA (PRENATAL VITAMINS) 28-0.8 MG TABS 1 tablet, Oral, Daily    ROS - all of the below systems have been reviewed with the patient and positives are indicated with bold text General: chills, fever or night sweats Eyes: blurry vision or double vision ENT: epistaxis or sore throat Allergy/Immunology: itchy/watery eyes or nasal congestion Hematologic/Lymphatic: bleeding problems, blood clots or swollen  lymph nodes Endocrine: temperature intolerance or unexpected weight changes Breast: new or changing breast lumps or nipple discharge Resp: cough, shortness of breath, or wheezing CV: chest pain or dyspnea on exertion GI: as per HPI GU: dysuria, trouble voiding, or hematuria MSK: joint pain or joint stiffness Neuro: TIA or stroke symptoms Derm: pruritus and skin lesion changes Psych: anxiety and depression  Objective   PE Blood pressure (!) 143/95, pulse (!) 59, temperature 98.8 F (37.1 C), temperature source Oral, resp. rate 18, height 5\' 3"  (1.6 m), weight 93 kg, last menstrual period 09/15/2022, SpO2 100 %, unknown if currently breastfeeding. Constitutional: NAD; conversant; no deformities Eyes: Moist conjunctiva; no lid lag; anicteric; PERRL Neck: Trachea midline; no thyromegaly Lungs: Normal respiratory effort; no tactile fremitus CV: RRR; no palpable thrills; no pitting edema GI: Abd Lower midline hernia; no palpable hepatosplenomegaly MSK: Normal range of motion of extremities; no clubbing/cyanosis Psychiatric: Appropriate affect; alert and oriented x3 Lymphatic: No palpable cervical or axillary lymphadenopathy  No results found for this or any previous visit (from the past 24 hour(s)).  Imaging Orders  No imaging studies ordered today   CT from outside hospital reviewed showing 4.0 cm wide x 7.8 cm ventral incisional hernia   Assessment and Plan   Emily Pham is an 43 y.o. female with an incisional hernia that measures 4.0 cm  wide x 7.8 cm tall.  I recommended robotic incisional hernia repair with mesh.  I explained the retro muscular placement of mesh and the possible mesh complications.  We discussed the procedure, its risks, benefits and alternatives.  After a full discussion and all questions answered, the patient granted consent to proceed.  We will proceed as scheduled.   Quentin Ore, MD  [facility] Use [email or web address] to contact on  call provider

## 2022-09-23 ENCOUNTER — Encounter (HOSPITAL_COMMUNITY): Payer: Self-pay | Admitting: Surgery

## 2022-09-23 DIAGNOSIS — K432 Incisional hernia without obstruction or gangrene: Secondary | ICD-10-CM | POA: Diagnosis not present

## 2022-09-23 LAB — BASIC METABOLIC PANEL
Anion gap: 8 (ref 5–15)
BUN: 8 mg/dL (ref 6–20)
CO2: 26 mmol/L (ref 22–32)
Calcium: 8.6 mg/dL — ABNORMAL LOW (ref 8.9–10.3)
Chloride: 102 mmol/L (ref 98–111)
Creatinine, Ser: 0.75 mg/dL (ref 0.44–1.00)
GFR, Estimated: >60 mL/min (ref >60)
Glucose, Bld: 115 mg/dL — ABNORMAL HIGH (ref 70–99)
Potassium: 4.2 mmol/L (ref 3.5–5.1)
Sodium: 136 mmol/L (ref 135–145)

## 2022-09-23 LAB — CBC
HCT: 38.3 % (ref 36.0–46.0)
Hemoglobin: 12.3 g/dL (ref 12.0–15.0)
MCH: 28 pg (ref 26.0–34.0)
MCHC: 32.1 g/dL (ref 30.0–36.0)
MCV: 87 fL (ref 80.0–100.0)
Platelets: 309 10*3/uL (ref 150–400)
RBC: 4.4 MIL/uL (ref 3.87–5.11)
RDW: 14.6 % (ref 11.5–15.5)
WBC: 9.3 10*3/uL (ref 4.0–10.5)
nRBC: 0 % (ref 0.0–0.2)

## 2022-09-23 MED ORDER — OXYCODONE-ACETAMINOPHEN 5-325 MG PO TABS: 1.0000 | ORAL_TABLET | ORAL | 0 refills | Status: DC | PRN

## 2022-09-23 MED ORDER — METHOCARBAMOL 750 MG PO TABS: 750.0000 mg | ORAL_TABLET | Freq: Four times a day (QID) | ORAL | 0 refills | Status: DC | PRN

## 2022-09-23 MED ORDER — ALUM & MAG HYDROXIDE-SIMETH 200-200-20 MG/5ML PO SUSP
30.0000 mL | Freq: Four times a day (QID) | ORAL | Status: DC | PRN
  Administered 2022-09-23: 30 mL via ORAL
  Filled 2022-09-23: qty 30

## 2022-09-23 NOTE — TOC CM/SW Note (Signed)
Transition of Care [facility]) - Inpatient Brief Assessment   Patient Details  Name: [name] MRN: [id number] Date of Birth: 1980-05-01  Transition of Care [facility]) CM/SW Contact:    Otelia Santee, LCSW Phone Number: 09/23/2022, 9:38 AM   Clinical Narrative: Chart reviewed. No TOC needs identified.    Transition of Care Asessment: Insurance and Status: Insurance coverage has been reviewed Patient has primary care physician: Yes Home environment has been reviewed: Single story home Prior level of function:: Independent Prior/Current Home Services: No current home services Social Determinants of Health Reivew: SDOH reviewed no interventions necessary Readmission risk has been reviewed: Yes Transition of care needs: no transition of care needs at this time

## 2022-09-23 NOTE — Discharge Instructions (Signed)
 VENTRAL HERNIA REPAIR POST OPERATIVE INSTRUCTIONS  Thinking Clearly  The anesthesia may cause you to feel different for 1 or 2 days. Do not drive, drink alcohol, or make any big decisions for at least 2 days.  Nutrition When you wake up, you will be able to drink small amounts of liquid. If you do not feel sick, you can slowly advance your diet to regular foods. Continue to drink lots of fluids, usually about 8 to 10 glasses per day. Eat a high-fiber diet so you don't strain during bowel movements. High-Fiber Foods Foods high in fiber include beans, bran cereals and whole-grain breads, peas, dried fruit (figs, apricots, and dates), raspberries, blackberries, strawberries, sweet corn, broccoli, baked potatoes with skin, plums, pears, apples, greens, and nuts. Activity Slowly increase your activity. Be sure to get up and walk every hour or so to prevent blood clots. No heavy lifting or strenuous activity for 4 weeks following surgery to prevent hernias at your incision sites or recurrence of your hernia. It is normal to feel tired. You may need more sleep than usual.  Get your rest but make sure to get up and move around frequently to prevent blood clots and pneumonia.  Work and Return to School You can go back to work when you feel well enough. Discuss the timing with your surgeon. You can usually go back to school or work 1 week or less after an laparoscopic or an open repair. If your work requires heavy lifting or strenuous activity you need to be placed on light duty for 4 weeks following surgery. You can return to gym class, sports or other physical activities 4 weeks after surgery.  Wound Care You may experience significant bruising throughout the abdominal wall that may track down into the groin including into the scrotum in males.  Rest, elevating the groin and scrotum above the level of the heart, ice and compression with tight fitting underwear or an abdominal binder can help.   Always wash your hands before and after touching near your incision site. Do not soak in a bathtub until cleared at your follow up appointment. You may take a shower 24 hours after surgery. A small amount of drainage from the incision is normal. If the drainage is thick and yellow or the site is red, you may have an infection, so call your surgeon. If you have a drain in one of your incisions, it will be taken out in office when the drainage stops. Steri-Strips will fall off in 7 to 10 days or they will be removed during your first office visit. If you have dermabond glue covering over the incision, allow the glue to flake off on its own. Protect the new skin, especially from the sun. The sun can burn and cause darker scarring. Your scar will heal in about 4 to 6 weeks and will become softer and continue to fade over the next year.  The cosmetic appearance of the incisions will improve over the course of the first year after surgery. Sensation around your incision will return in a few weeks or months.  Bowel Movements After intestinal surgery, you may have loose watery stools for several days. If watery diarrhea lasts longer than 3 days, contact your surgeon. Pain medication (narcotics) can cause constipation. Increase the fiber in your diet with high-fiber foods if you are constipated. You can take an over the counter stool softener like Colace to avoid constipation.  Additional over the counter medications can also be used   if Colace isn't sufficient (for example, Milk of Magnesia or Miralax).  Pain The amount of pain is different for each person. Some people need only 1 to 3 doses of pain control medication, while others need more. Take alternating doses of tylenol and ibuprofen around the clock for the first five days following surgery.  This will provide a baseline of pain control and help with inflammation.  Take the narcotic pain medication in addition if needed for severe pain.  Contact  Your Surgeon at [phone], if you have: Pain that will not go away Pain that gets worse A fever of more than 101F (38.3C) Repeated vomiting Swelling, redness, bleeding, or bad-smelling drainage from your wound site Strong abdominal pain No bowel movement or unable to pass gas for 3 days Watery diarrhea lasting longer than 3 days  Pain Control The goal of pain control is to minimize pain, keep you moving and help you heal. Your surgical team will work with you on your pain plan. Most often a combination of therapies and medications are used to control your pain. You may also be given medication (local anesthetic) at the surgical site. This may help control your pain for several days. Extreme pain puts extra stress on your body at a time when your body needs to focus on healing. Do not wait until your pain has reached a level "10" or is unbearable before telling your doctor or nurse. It is much easier to control pain before it becomes severe. Following a laparoscopic procedure, pain is sometimes felt in the shoulder. This is due to the gas inserted into your abdomen during the procedure. Moving and walking helps to decrease the gas and the right shoulder pain.  Use the guide below for ways to manage your post-operative pain. Learn more by going to facs.org/safepaincontrol.  How Intense Is My Pain Common Therapies to Feel Better       I hardly notice my pain, and it does not interfere with my activities.  I notice my pain and it distracts me, but I can still do activities (sitting up, walking, standing).  Non-Medication Therapies  Ice (in a bag, applied over clothing at the surgical site), elevation, rest, meditation, massage, distraction (music, TV, play) walking and mild exercise Splinting the abdomen with pillows +  Non-Opioid Medications Acetaminophen (Tylenol) Non-steroidal anti-inflammatory drugs (NSAIDS) Aspirin, Ibuprofen (Motrin, Advil) Naproxen (Aleve) Take these as  needed, when you feel pain. Both acetaminophen and NSAIDs help to decrease pain and swelling (inflammation).      My pain is hard to ignore and is more noticeable even when I rest.  My pain interferes with my usual activities.  Non-Medication Therapies  +  Non-Opioid medications  Take on a regular schedule (around-the-clock) instead of as needed. (For example, Tylenol every 6 hours at 9:00 am, 3:00 pm, 9:00 pm, 3:00 am and Motrin every 6 hours at 12:00 am, 6:00 am, 12:00 pm, 6:00 pm)         I am focused on my pain, and I am not doing my daily activities.  I am groaning in pain, and I cannot sleep. I am unable to do anything.  My pain is as bad as it could be, and nothing else matters.  Non-Medication Therapies  +  Around-the-Clock Non-Opioid Medications  +  Short-acting opioids  Opioids should be used with other medications to manage severe pain. Opioids block pain and give a feeling of euphoria (feel high). Addiction, a serious side effect of opioids, is   rare with short-term (a few days) use.  Examples of short-acting opioids include: Tramadol (Ultram), Hydrocodone (Norco, Vicodin), Hydromorphone (Dilaudid), Oxycodone (Oxycontin)     The above directions have been adapted from the American College [employer] Surgical Patient Education Program.  Please refer to the ACS website if needed: [email or web address]   Anisten Tomassi, MD [facility] [address], [address], [place], [place]  [address] ?  [address] [address], , [place]   [address] [phone] ? [phone] ? FAX [phone] Web site: [email or web address]  

## 2022-09-23 NOTE — Discharge Summary (Signed)
  Patient ID: Emily Pham [id number] 42 y.o. [birth year]  09/22/2022  Discharge date and time: 09/23/2022  Admitting Physician: Emily Pham  Discharge Physician: Emily Pham  Admission Diagnoses: Incisional hernia [K43.2] Patient Active Problem List   Diagnosis Date Noted   Incisional hernia 09/22/2022   History of depression 12/11/2021   Uterine fibroids affecting pregnancy 12/11/2021   Failed induction of labor - AOD 12/11/2021   Status post primary low transverse cesarean section 12/11/2021   Postpartum care following cesarean delivery 9/14 12/11/2021   Encounter for induction of labor 12/10/2021     Discharge Diagnoses:  Patient Active Problem List   Diagnosis Date Noted   Incisional hernia 09/22/2022   History of depression 12/11/2021   Uterine fibroids affecting pregnancy 12/11/2021   Failed induction of labor - AOD 12/11/2021   Status post primary low transverse cesarean section 12/11/2021   Postpartum care following cesarean delivery 9/14 12/11/2021   Encounter for induction of labor 12/10/2021    Operations: Procedure(s): ROBOTIC INCISIONAL HERNIA REPAIR WITH MESH  Admission Condition: good  Discharged Condition: good  Indication for Admission: Incisional Hernia  Hospital Course: Emily Pham underwent robotic incisional hernia repair and was discharged the following day  Consults: None  Significant Diagnostic Studies: None  Treatments: surgery: as above  Disposition: Home  Patient Instructions:  Allergies as of 09/23/2022       Reactions   Codeine Other (See Comments)   Family allergy        Medication List     STOP taking these medications    acetaminophen 325 MG tablet Commonly known as: TYLENOL   ibuprofen 600 MG tablet Commonly known as: ADVIL   oxyCODONE 5 MG immediate release tablet Commonly known as: Oxy IR/ROXICODONE   Prenatal Vitamins 28-0.8 MG Tabs       TAKE these medications    methocarbamol 750  MG tablet Commonly known as: Robaxin-750 Take 1 tablet (750 mg total) by mouth every 6 (six) hours as needed for muscle spasms.   oxyCODONE-acetaminophen 5-325 MG tablet Commonly known as: Percocet Take 1 tablet by mouth every 4 (four) hours as needed for severe pain.   pantoprazole 40 MG tablet Commonly known as: PROTONIX Take 40 mg by mouth daily.   VITAMIN D3 PO Take 1 tablet by mouth daily.        Activity: no heavy lifting for 4 weeks Diet: regular diet Wound Care: keep wound clean and dry  Follow-up:  With Dr. Dossie Der in 4 weeks.  Signed: Hyman Hopes Antigone Crowell [facility] [place]   09/23/2022, 7:22 AM

## 2022-09-24 NOTE — Anesthesia Postprocedure Evaluation (Signed)
Anesthesia Post Note  Patient: Emily Pham  Procedure(s) Performed: ROBOTIC INCISIONAL HERNIA REPAIR WITH MESH     Patient location during evaluation: PACU Anesthesia Type: General Level of consciousness: awake and alert Pain management: pain level controlled Vital Signs Assessment: post-procedure vital signs reviewed and stable Respiratory status: spontaneous breathing, nonlabored ventilation, respiratory function stable and patient connected to nasal cannula oxygen Cardiovascular status: blood pressure returned to baseline and stable Postop Assessment: no apparent nausea or vomiting Anesthetic complications: no   No notable events documented.  Last Vitals:  Vitals:   09/23/22 0227 09/23/22 0500  BP: 127/80 114/74  Pulse: 75 (!) 58  Resp: 18 18  Temp: 36.6 C 36.7 C  SpO2: 100% 100%    Last Pain:  Vitals:   09/23/22 0920  TempSrc:   PainSc: Asleep                 Nelle Don Laterica Matarazzo

## 2022-10-28 NOTE — Therapy (Signed)
OUTPATIENT PHYSICAL THERAPY THORACOLUMBAR EVALUATION   Patient Name: Emily Pham MRN: [id number] DOB:[birth year], 42 y.o., female Today's Date: 10/29/2022  END OF SESSION:  PT End of Session - 10/29/22 1452     Visit Number 1    Date for PT Re-Evaluation 11/26/22    Authorization Type [employer]    PT Start Time 1452    PT Stop Time 1527    PT Time Calculation (min) 35 min    Activity Tolerance Patient tolerated treatment well    Behavior During Therapy WFL for tasks assessed/performed             Past Medical History:  Diagnosis Date   Anxiety    GERD (gastroesophageal reflux disease)    History of chicken pox    Hypertension    Vitamin D deficiency    Past Surgical History:  Procedure Laterality Date   CESAREAN SECTION N/A 12/11/2021   Procedure: CESAREAN SECTION;  Surgeon: Noland Fordyce, MD;  Location: [facility]S;  Service: Obstetrics;  Laterality: N/A;   NO PAST SURGERIES     TONSILLECTOMY     XI ROBOTIC ASSISTED VENTRAL HERNIA N/A 09/22/2022   Procedure: ROBOTIC INCISIONAL HERNIA REPAIR WITH MESH;  Surgeon: Stechschulte, Hyman Hopes, MD;  Location: [facility] ORS;  Service: General;  Laterality: N/A;   Patient Active Problem List   Diagnosis Date Noted   Incisional hernia 09/22/2022   History of depression 12/11/2021   Uterine fibroids affecting pregnancy 12/11/2021   Failed induction of labor - AOD 12/11/2021   Status post primary low transverse cesarean section 12/11/2021   Postpartum care following cesarean delivery 9/14 12/11/2021   Encounter for induction of labor 12/10/2021    PCP: Copland, Gwenlyn Found, MD   REFERRING PROVIDER: Quentin Ore, MD   REFERRING DIAG:  K43.2 (ICD-10-CM) - Incisional hernia without obstruction or gangrene  Z98.890 (ICD-10-CM) - Other specified postprocedural states   Would benefit from physical therapy for general core health - abdominal wall, pelvic floor, spine recovery from pregnancy, c-section and now hernia surgery. Will  send referral. Free to perform any exercises starting at 4 weeks post op. Ease into activity.   Rationale for Evaluation and Treatment: Rehabilitation  THERAPY DIAG:  Other symptoms and signs involving the musculoskeletal system  Muscle weakness (generalized)  ONSET DATE: 09/22/22  SUBJECTIVE:                                                                                                                                                                                           SUBJECTIVE STATEMENT: When she had her hernia surgery, he tightened her abdominal muscles and  they are really tight and uncomfortable. Morning it is fine but by the end of the day she is walking like a 42 yr old woman. And then picking up her baby it tugs in lower abdomen. Feels like everything is going to fall out so wears a binder which helps. Toileting restrictive   PERTINENT HISTORY:  12/12/22  Csection and hernia surgery 09/22/22  PAIN:  Are you having pain? Yes: NPRS scale: 4-5/10 Pain location: lower abdomen R and Rectus abdominus Pain description: tugging, feels swollen Aggravating factors: end of day, lifting Relieving factors: rest sitting or lying  PRECAUTIONS: None  RED FLAGS: None   WEIGHT BEARING RESTRICTIONS: No  FALLS:  Has patient fallen in last 6 months? 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  PLOF: Independent  PATIENT GOALS: strengthen and feel looser  NEXT MD VISIT: none scheduled  OBJECTIVE:   DIAGNOSTIC FINDINGS:  none   COGNITION: Overall cognitive status: Within functional limits for tasks assessed     SENSATION: WFL  MUSCLE LENGTH: Tight rectus abdominus and obliques  POSTURE: increased lumbar lordosis  LUMBAR ROM:   AROM eval  Flexion full  Extension limited  Right lateral flexion full   Left lateral flexion full  Right rotation 50%  Left rotation 90%   (Blank rows = not tested)  LOWER EXTREMITY ROM:   WFL  LOWER EXTREMITY MMT:  *unable to flex B hip in sitting without hands bracing on table  MMT Right eval Left eval  Hip flexion 5* 5*  Hip extension 4 4  Hip abduction 5 4+  Hip adduction 4 4  Hip internal rotation    Hip external rotation    Knee flexion    Knee extension    Ankle dorsiflexion    Ankle plantarflexion    Ankle inversion    Ankle eversion     (Blank rows = not tested)    TODAY'S TREATMENT:                                                                                                                              DATE:   10/29/22 See pt ed and HEP Supine OH reach with ball - cues to engage TA; also did in hooklying and with one leg straight LTR Open Book B Lumbar ext at wall Hooklying TA/pelvic floor contraction   PATIENT EDUCATION:  Education details: PT eval findings, anticipated POC, and initial HEP  Person educated: Patient Education method: Explanation, Demonstration, and Handouts Education comprehension: verbalized understanding and returned demonstration  HOME EXERCISE PROGRAM: Access Code: [email or web address] URL: [email or web address] Date: 10/29/2022 Prepared by: Raynelle Fanning  Exercises - Supine Lower Trunk Rotation  - 2 x daily - 7 x weekly - 1 sets - 5 reps - 10 sec hold - Sidelying Thoracic Rotation with Open Book  -  2 x daily - 7 x weekly - 2 sets - 5 reps - Standing Lumbar Extension at Wall - Forearms  - 2 x daily - 7 x weekly - 1 sets - 10 reps - 5 sec hold - Supine Shoulder Flexion Extension Full Range AROM  - 2 x daily - 7 x weekly - 1 sets - 10 reps - Supine Pelvic Floor Contract and Release  - 2 x daily - 7 x weekly - 1 sets - 10 reps  ASSESSMENT:  CLINICAL IMPRESSION: Patient is a 42 y.o. female who was seen today for physical therapy evaluation and treatment for abdominal pain and tightness s/p hernia  surgery on 09/22/22 and Csection on 12/11/21. She reports that the surgeon tightened up her abdominals during the hernia surgery now and she has subsequent pain and tightness. She will benefit from skilled PT to address these deficits.     OBJECTIVE IMPAIRMENTS: decreased strength, increased fascial restrictions, impaired flexibility, postural dysfunction, and pain.   ACTIVITY LIMITATIONS: lifting and lumbar extension  PARTICIPATION LIMITATIONS:  n/a  PERSONAL FACTORS: Fitness are also affecting patient's functional outcome.   REHAB POTENTIAL: Excellent  CLINICAL DECISION MAKING: Stable/uncomplicated  EVALUATION COMPLEXITY: Low   GOALS: Goals reviewed with patient? Yes  SHORT TERM GOALS: Target date: 12/06/22  Ind with initial HEP Baseline: Goal status: INITIAL   LONG TERM GOALS: Target date: 11/26/22  Ind with advanced HEP for flexibility and core strengthening Baseline:  Goal status: INITIAL  2.  Pt to demo full functional lumbar ROM without pain provocation Baseline:  Goal status: INITIAL  3.  Pt able to toilet without difficulty Baseline:  Goal status: INITIAL  4.  Pt to report decreased pain by 75% or more to improve QOL. Baseline:  Goal status: INITIAL  PLAN:  PT FREQUENCY: 1-2x/week  PT DURATION: 4 weeks  PLANNED INTERVENTIONS: Therapeutic exercises, Therapeutic activity, Neuromuscular re-education, Patient/Family education, Self Care, Dry Needling, Electrical stimulation, Spinal mobilization, Cryotherapy, Moist heat, Taping, and Manual therapy.  PLAN FOR NEXT SESSION: assess response to HEP and progress, manual therapy/scar massage if indicated; core strengthening   Kaleyah Labreck, PT 10/29/2022, 5:55 PM

## 2022-10-29 ENCOUNTER — Other Ambulatory Visit: Payer: Self-pay

## 2022-10-29 ENCOUNTER — Ambulatory Visit: Payer: BC Managed Care – PPO | Attending: Surgery | Admitting: Physical Therapy

## 2022-10-29 ENCOUNTER — Encounter: Payer: Self-pay | Admitting: Physical Therapy

## 2022-10-29 DIAGNOSIS — Z9889 Other specified postprocedural states: Secondary | ICD-10-CM | POA: Insufficient documentation

## 2022-10-29 DIAGNOSIS — R29898 Other symptoms and signs involving the musculoskeletal system: Secondary | ICD-10-CM

## 2022-10-29 DIAGNOSIS — M6281 Muscle weakness (generalized): Secondary | ICD-10-CM | POA: Diagnosis not present

## 2022-11-04 ENCOUNTER — Ambulatory Visit: Payer: BC Managed Care – PPO | Admitting: Physical Therapy

## 2022-11-04 DIAGNOSIS — M6281 Muscle weakness (generalized): Secondary | ICD-10-CM

## 2022-11-04 DIAGNOSIS — Z9889 Other specified postprocedural states: Secondary | ICD-10-CM | POA: Diagnosis not present

## 2022-11-04 NOTE — Therapy (Addendum)
OUTPATIENT PHYSICAL THERAPY THORACOLUMBAR TREATMENT   Patient Name: Emily Pham MRN: [id number] DOB:1980/06/22, 42 y.o., female Today's Date: 11/04/2022  END OF SESSION:  PT End of Session - 11/04/22 1536     Visit Number 2    Date for PT Re-Evaluation 11/26/22    Authorization Type BCBS    PT Start Time 1532    PT Stop Time 1615    PT Time Calculation (min) 43 min    Activity Tolerance Patient tolerated treatment well    Behavior During Therapy WFL for tasks assessed/performed             Past Medical History:  Diagnosis Date   Anxiety    GERD (gastroesophageal reflux disease)    History of chicken pox    Hypertension    Vitamin D deficiency    Past Surgical History:  Procedure Laterality Date   CESAREAN SECTION N/A 12/11/2021   Procedure: CESAREAN SECTION;  Surgeon: Noland Fordyce, MD;  Location: [facility];  Service: Obstetrics;  Laterality: N/A;   NO PAST SURGERIES     TONSILLECTOMY     XI ROBOTIC ASSISTED VENTRAL HERNIA N/A 09/22/2022   Procedure: ROBOTIC INCISIONAL HERNIA REPAIR WITH MESH;  Surgeon: Stechschulte, Hyman Hopes, MD;  Location: [facility];  Service: General;  Laterality: N/A;   Patient Active Problem List   Diagnosis Date Noted   Incisional hernia 09/22/2022   History of depression 12/11/2021   Uterine fibroids affecting pregnancy 12/11/2021   Failed induction of labor - AOD 12/11/2021   Status post primary low transverse cesarean section 12/11/2021   Postpartum care following cesarean delivery 9/14 12/11/2021   Encounter for induction of labor 12/10/2021    PCP: Copland, Gwenlyn Found, MD   REFERRING PROVIDER: Quentin Ore, MD   REFERRING DIAG:  K43.2 (ICD-10-CM) - Incisional hernia without obstruction or gangrene  Z98.890 (ICD-10-CM) - Other specified postprocedural states   Would benefit from physical therapy for general core health - abdominal wall, pelvic floor, spine recovery from pregnancy, c-section and now hernia surgery. Will send  referral. Free to perform any exercises starting at 4 weeks post op. Ease into activity.   Rationale for Evaluation and Treatment: Rehabilitation  THERAPY DIAG:  Muscle weakness (generalized)  ONSET DATE: 09/22/22  SUBJECTIVE:                                                                                                                                                                                           SUBJECTIVE STATEMENT: Pt reports she feels like "something is going to fall out with activity", tightness around abdomen and feels like "I can't  catch a breath"  PERTINENT HISTORY:  12/12/22  Csection and hernia surgery 09/22/22  PAIN:  Are you having pain? Yes: NPRS scale: 4-5/10 Pain location: lower abdomen R and Rectus abdominus Pain description: tugging, feels swollen Aggravating factors: end of day, lifting Relieving factors: rest sitting or lying  PRECAUTIONS: None  RED FLAGS: None   WEIGHT BEARING RESTRICTIONS: No  FALLS:  Has patient fallen in last 6 months? 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  PLOF: Independent  PATIENT GOALS: strengthen and feel looser  NEXT MD VISIT: none scheduled  OBJECTIVE:   DIAGNOSTIC FINDINGS:  none   COGNITION: Overall cognitive status: Within functional limits for tasks assessed     SENSATION: WFL  MUSCLE LENGTH: Tight rectus abdominus and obliques  POSTURE: increased lumbar lordosis  LUMBAR ROM:   AROM eval  Flexion full  Extension limited  Right lateral flexion full  Left lateral flexion full  Right rotation 50%  Left rotation 90%   (Blank rows = not tested)  LOWER EXTREMITY ROM:   WFL  LOWER EXTREMITY MMT:  *unable to flex B hip in sitting without hands bracing on table  MMT Right eval Left eval  Hip flexion 5* 5*  Hip  extension 4 4  Hip abduction 5 4+  Hip adduction 4 4  Hip internal rotation    Hip external rotation    Knee flexion    Knee extension    Ankle dorsiflexion    Ankle plantarflexion    Ankle inversion    Ankle eversion     (Blank rows = not tested)  Vaginal strength 5/5, 10s, 7 reps  DRA <1 finger separation above or below with activation at core/crunch. Does need cues for TA activation for improved mechanics         No dryness, internal and external pelvic floor WFL, no pain   TODAY'S TREATMENT:                                                                                                                              DATE:   10/29/22 See pt ed and HEP Supine OH reach with ball - cues to engage TA; also did in hooklying and with one leg straight LTR Open Book B Lumbar ext at wall Hooklying TA/pelvic floor contraction 11/04/22: Patient consented to internal pelvic floor assessment vaginally this date and found to have good strength at pelvic floor and denies leakage. Does endorse feeling like something is going to fall out with mobility and sneezing/coughing/laughing. No prolapse seen with hooklying with coughing. Pt cued for pelvic floor contraction with coordination of cough. Pt able to complete this with good ability and technique, no downward pressure noted and pt reports she is able to feel the difference.  C-section scar assessed for mobility - noted  some keloid scarring at middle of scar, restriction with mobility in all directions at medial rt and lt of scar, better at ends.  Fascial restrictions noted throughout abdomen but worse at Rt side, pt denied pain but reports discomfort and feels restricted. "Like I can't get a full breath". Pt also demonstrates chest breathing, has limited rib mobility with expansion and remain flared bil.  HEP updated for diaphragmatic breathing and pt completed x10 reps of this during session in hooklying and sitting with cues for technique and pt  able to correct, cat/cow also added for spinal mobility.  Scar mobility hand out given and reviewed with pt, denied questions.   PATIENT EDUCATION:  Education details: PT eval findings, anticipated POC, and initial HEP  Person educated: Patient Education method: Explanation, Demonstration, and Handouts Education comprehension: verbalized understanding and returned demonstration  HOME EXERCISE PROGRAM: Access Code: [email or web address] URL: [email or web address] Date: 10/29/2022 Prepared by: Raynelle Fanning  Exercises - Supine Lower Trunk Rotation  - 2 x daily - 7 x weekly - 1 sets - 5 reps - 10 sec hold - Sidelying Thoracic Rotation with Open Book  - 2 x daily - 7 x weekly - 2 sets - 5 reps - Standing Lumbar Extension at Wall - Forearms  - 2 x daily - 7 x weekly - 1 sets - 10 reps - 5 sec hold - Supine Shoulder Flexion Extension Full Range AROM  - 2 x daily - 7 x weekly - 1 sets - 10 reps - Supine Pelvic Floor Contract and Release  - 2 x daily - 7 x weekly - 1 sets - 10 reps  ASSESSMENT:  CLINICAL IMPRESSION: Patient presents for treatment. Pt is postpartum, states pain at bra line intermittently, limited mobility at abdomen status post c-section and hernia repair, and feels "like something is falling out" with mobility sometimes and always with sneezing/laughing/coughing. Pt did consent to internal vaginal assessment today, good strength found but educated and treated with improved coordination of pelvic floor with stressors and pt reported this helped with decreased pressure felt with this. Also pt tolerated gentle mobility at bil ribs and diaphragm but would benefit from additional manual work for further mobility here and at spine.  She will benefit from skilled PT to address these deficits.     OBJECTIVE IMPAIRMENTS: decreased strength, increased fascial restrictions, impaired flexibility, postural dysfunction, and pain.   ACTIVITY LIMITATIONS: lifting and lumbar  extension  PARTICIPATION LIMITATIONS:  n/a  PERSONAL FACTORS: Fitness are also affecting patient's functional outcome.   REHAB POTENTIAL: Excellent  CLINICAL DECISION MAKING: Stable/uncomplicated  EVALUATION COMPLEXITY: Low   GOALS: Goals reviewed with patient? Yes  SHORT TERM GOALS: Target date: 12/06/22  Ind with initial HEP Baseline: Goal status: INITIAL   LONG TERM GOALS: Target date: 11/26/22  Ind with advanced HEP for flexibility and core strengthening Baseline:  Goal status: INITIAL  2.  Pt to demo full functional lumbar ROM without pain provocation Baseline:  Goal status: INITIAL  3.  Pt able to toilet without difficulty Baseline:  Goal status: INITIAL  4.  Pt to report decreased pain by 75% or more to improve QOL. Baseline:  Goal status: INITIAL  PLAN:  PT FREQUENCY: 1-2x/week  PT DURATION: 4 weeks  PLANNED INTERVENTIONS: Therapeutic exercises, Therapeutic activity, Neuromuscular re-education, Patient/Family education, Self Care, Dry Needling, Electrical stimulation, Spinal mobilization, Cryotherapy, Moist heat, Taping, and Manual therapy.  PLAN FOR NEXT SESSION: assess response to HEP and progress, manual therapy/scar massage if indicated; core strengthening, DN paraspinals,  diaphragm mobility, rib mobility, diaphragmatic breathing, coordination pelvic floor contraction with push/pull/lifting    Otelia Sergeant, PT, DPT 08/07/244:27 PM   PHYSICAL THERAPY DISCHARGE SUMMARY  Visits from Start of Care: 2  Current functional level related to goals / functional outcomes: See above for current status.  Pt didn't return to PT with multiple no show appts.     Remaining deficits: See above.   Education / Equipment: HEP   Patient agrees to discharge. Patient goals were not met. Patient is being discharged due to not returning since the last visit.  Lorrene Reid, PT 12/02/22 4:42 PM
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
## 2022-11-11 ENCOUNTER — Ambulatory Visit: Payer: BC Managed Care – PPO | Admitting: Physical Therapy

## 2022-11-12 ENCOUNTER — Ambulatory Visit: Payer: BC Managed Care – PPO | Admitting: Physical Therapy

## 2022-11-25 ENCOUNTER — Ambulatory Visit: Payer: BC Managed Care – PPO | Admitting: Physical Therapy

## 2022-12-02 ENCOUNTER — Ambulatory Visit: Payer: BC Managed Care – PPO | Attending: Surgery

## 2022-12-02 DIAGNOSIS — Z9889 Other specified postprocedural states: Secondary | ICD-10-CM | POA: Insufficient documentation

## 2022-12-02 DIAGNOSIS — M6281 Muscle weakness (generalized): Secondary | ICD-10-CM | POA: Insufficient documentation

## 2022-12-02 DIAGNOSIS — R29898 Other symptoms and signs involving the musculoskeletal system: Secondary | ICD-10-CM | POA: Insufficient documentation

## 2023-03-29 ENCOUNTER — Telehealth: Payer: Self-pay | Admitting: Family Medicine

## 2023-03-29 NOTE — Telephone Encounter (Signed)
LVM advising patient she is overdue for physical. Requested call back to schedule appt.

## 2023-03-30 NOTE — Progress Notes (Addendum)
 [facility] [address], [address] [place], [place] [address] [phone]  Date:  04/01/2023   Name:  Emily Pham   DOB:  [birth year]   MRN:  [id number]  PCP:  Watt Harlene BROCKS, MD    Chief Complaint: Annual Exam (Concerns/ questions: none/Flu shot today: differs /)   History of Present Illness:  Emily Pham is a 42 y.o. very pleasant female patient who presents with the following:  Patient seen today for physical exam.  Generally in good health I last saw her in January 2022 History of GERD, allergies, hypertension, depression  Since her last visit she had a baby (her first) via C-section, delivered September 2023- she had a baby girl, now 15 months and doing great She also had repair of incisional hernia summer 2024- this is doing ok   She was on BP meds for a while in her early 30s but not since then At home her BP may run 135-140/85- 105 She does not remember what she was taking for BP in the past  She was not treated for hypertension during pregnancy or postpartum  History of low vitamin D    BP Readings from Last 3 Encounters:  04/01/23 120/80  09/23/22 114/74  09/10/22 138/80   Flu vaccine- give today  COVID booster-recommended Pap smear- UTD Fogleman  Mammogram- done, per patient this was normal but she has dense breasts Can update blood work today  She does not drink or smoke Some exercise at work  She does note GERD- she saw GI in the past but not in the last 5 years or so She has to take protonix  or she will have terrible reflux  She has never had an upper GI endoscopy  Patient notes family history of breast and ovarian cancer.  Her mother had breast cancer.  Her father's mother had bilateral breast cancer, her second cousin had ovarian cancer at quite a young age Patient Active Problem List   Diagnosis Date Noted   Incisional hernia 09/22/2022   History of depression 12/11/2021   Uterine  fibroids affecting pregnancy 12/11/2021   Status post primary low transverse cesarean section 12/11/2021    Past Medical History:  Diagnosis Date   Anxiety    GERD (gastroesophageal reflux disease)    History of chicken pox    Hypertension    Vitamin D  deficiency     Past Surgical History:  Procedure Laterality Date   CESAREAN SECTION N/A 12/11/2021   Procedure: CESAREAN SECTION;  Surgeon: Kandyce Sor, MD;  Location: [facility];  Service: Obstetrics;  Laterality: N/A;   NO PAST SURGERIES     TONSILLECTOMY     XI ROBOTIC ASSISTED VENTRAL HERNIA N/A 09/22/2022   Procedure: ROBOTIC INCISIONAL HERNIA REPAIR WITH MESH;  Surgeon: Stechschulte, Deward PARAS, MD;  Location: [facility];  Service: General;  Laterality: N/A;    Social History   Tobacco Use   Smoking status: Never   Smokeless tobacco: Never  Vaping Use   Vaping status: Never Used  Substance Use Topics   Alcohol use: No   Drug use: No    Family History  Problem Relation Age of Onset   Cancer Mother        Breast cancer dx age 39   Arthritis Mother    Diabetes Other        Grandparents   Stroke Other        Grandparents  Arthritis Father    Diabetes Maternal Grandmother    Hypertension Maternal Grandmother     Allergies  Allergen Reactions   Codeine Other (See Comments)    Family allergy    Medication list has been reviewed and updated.  No current outpatient medications on file prior to visit.   No current facility-administered medications on file prior to visit.    Review of Systems:  As per HPI- otherwise negative.   Physical Examination: Vitals:   04/01/23 1355 04/01/23 1416  BP: (!) 140/89 120/80  Pulse: 67   Resp: 18   Temp: 98 F (36.7 C)   SpO2: 98%    Vitals:   04/01/23 1355  Weight: 189 lb 9.6 oz (86 kg)  Height: 5' 3.5 (1.613 m)   Body mass index is 33.06 kg/m. Ideal Body Weight: Weight in (lb) to have BMI = 25: 143.1  GEN: no acute distress.  Mild obesity, looks  well HEENT: Atraumatic, Normocephalic.  Bilateral TM wnl, oropharynx normal.  PEERL,EOMI.   Ears and Nose: No external deformity. CV: RRR, No M/G/R. No JVD. No thrill. No extra heart sounds. PULM: CTA B, no wheezes, crackles, rhonchi. No retractions. No resp. distress. No accessory muscle use. ABD: S, NT, ND, +BS. No rebound. No HSM. EXTR: No c/c/e PSYCH: Normally interactive. Conversant.    Assessment and Plan: Physical exam - Plan: Flu vaccine trivalent PF, 6mos and older(Flulaval,Afluria,Fluarix,Fluzone)  Screening for diabetes mellitus - Plan: Comprehensive metabolic panel, Hemoglobin A1c  Screening, lipid - Plan: Lipid panel  Thyroid  disorder screening - Plan: TSH  Fatigue, unspecified type - Plan: TSH, VITAMIN D  25 Hydroxy (Vit-D Deficiency, Fractures)  Screening for deficiency anemia - Plan: CBC  Family history of breast cancer - Plan: Ambulatory referral to Genetics  Chronic GERD - Plan: pantoprazole  (PROTONIX ) 40 MG tablet, Ambulatory referral to Gastroenterology  Immunization due - Plan: Flu vaccine trivalent PF, 6mos and older(Flulaval,Afluria,Fluarix,Fluzone)   Physical exam today.  Encouraged healthy diet and exercise routine Gave flu vaccine, recommend COVID vaccine Patient has chronic reflux which is generally controlled with Protonix - however given chronic reflux I would like her to see Gi as she may need a scope There is a family history of breast and ovarian cancer- will refer to genetics to evaluate for any genetic cancer risk factors  Will plan further follow- up pending labs.  Signed Harlene Schroeder, MD  04/02/2023-received labs as below, message to patient  Results for orders placed or performed in visit on 04/01/23  CBC   Collection Time: 04/01/23  2:31 PM  Result Value Ref Range   WBC 5.2 4.0 - 10.5 K/uL   RBC 4.42 3.87 - 5.11 Mil/uL   Platelets 322.0 150.0 - 400.0 K/uL   Hemoglobin 12.5 12.0 - 15.0 g/dL   HCT 61.4 63.9 - 53.9 %   MCV 87.2 78.0  - 100.0 fl   MCHC 32.4 30.0 - 36.0 g/dL   RDW 84.2 (H) 88.4 - 84.4 %  Comprehensive metabolic panel   Collection Time: 04/01/23  2:31 PM  Result Value Ref Range   Sodium 139 135 - 145 mEq/L   Potassium 3.8 3.5 - 5.1 mEq/L   Chloride 106 96 - 112 mEq/L   CO2 25 19 - 32 mEq/L   Glucose, Bld 86 70 - 99 mg/dL   BUN 11 6 - 23 mg/dL   Creatinine, Ser 9.19 0.40 - 1.20 mg/dL   Total Bilirubin 0.3 0.2 - 1.2 mg/dL   Alkaline Phosphatase 63 39 -  117 U/L   AST 13 0 - 37 U/L   ALT 6 0 - 35 U/L   Total Protein 6.7 6.0 - 8.3 g/dL   Albumin 4.1 3.5 - 5.2 g/dL   GFR 08.68 >39.99 mL/min   Calcium  9.4 8.4 - 10.5 mg/dL  Hemoglobin J8r   Collection Time: 04/01/23  2:31 PM  Result Value Ref Range   Hgb A1c MFr Bld 5.6 4.6 - 6.5 %  Lipid panel   Collection Time: 04/01/23  2:31 PM  Result Value Ref Range   Cholesterol 222 (H) 0 - 200 mg/dL   Triglycerides 15.9 0.0 - 149.0 mg/dL   HDL 897.09 >60.99 mg/dL   VLDL 83.1 0.0 - 59.9 mg/dL   LDL Cholesterol 897 (H) 0 - 99 mg/dL   Total CHOL/HDL Ratio 2    NonHDL 118.73   TSH   Collection Time: 04/01/23  2:31 PM  Result Value Ref Range   TSH 0.43 0.35 - 5.50 uIU/mL  VITAMIN D  25 Hydroxy (Vit-D Deficiency, Fractures)   Collection Time: 04/01/23  2:31 PM  Result Value Ref Range   VITD 16.00 (L) 30.00 - 100.00 ng/mL

## 2023-03-30 NOTE — Patient Instructions (Addendum)
 It was great to see you again today-I will be in touch with your labs Recommend COVID booster if not up-to-date Flu shot today Referral to  -medical genetics -gastroenterology regarding chronic reflux   Work on getting regular exercise BP looks good today- we are glad to check your home cuff for accuracy if you like!  BP Readings from Last 3 Encounters:  04/01/23 120/80  09/23/22 114/74  09/10/22 138/80

## 2023-04-01 ENCOUNTER — Ambulatory Visit (INDEPENDENT_AMBULATORY_CARE_PROVIDER_SITE_OTHER): Payer: 59 | Admitting: Family Medicine

## 2023-04-01 VITALS — BP 120/80 | HR 67 | Temp 98.0°F | Resp 18 | Ht 63.5 in | Wt 189.6 lb

## 2023-04-01 DIAGNOSIS — Z1329 Encounter for screening for other suspected endocrine disorder: Secondary | ICD-10-CM

## 2023-04-01 DIAGNOSIS — Z13220 Encounter for screening for lipoid disorders: Secondary | ICD-10-CM | POA: Diagnosis not present

## 2023-04-01 DIAGNOSIS — Z131 Encounter for screening for diabetes mellitus: Secondary | ICD-10-CM | POA: Diagnosis not present

## 2023-04-01 DIAGNOSIS — E559 Vitamin D deficiency, unspecified: Secondary | ICD-10-CM

## 2023-04-01 DIAGNOSIS — Z23 Encounter for immunization: Secondary | ICD-10-CM

## 2023-04-01 DIAGNOSIS — R5383 Other fatigue: Secondary | ICD-10-CM | POA: Diagnosis not present

## 2023-04-01 DIAGNOSIS — Z130 Encounter for screening for diseases of the blood and blood-forming organs and certain disorders involving the immune mechanism: Secondary | ICD-10-CM | POA: Diagnosis not present

## 2023-04-01 DIAGNOSIS — Z0000 Encounter for general adult medical examination without abnormal findings: Secondary | ICD-10-CM

## 2023-04-01 DIAGNOSIS — K219 Gastro-esophageal reflux disease without esophagitis: Secondary | ICD-10-CM

## 2023-04-01 DIAGNOSIS — Z803 Family history of malignant neoplasm of breast: Secondary | ICD-10-CM

## 2023-04-01 MED ORDER — PANTOPRAZOLE SODIUM 40 MG PO TBEC
40.0000 mg | DELAYED_RELEASE_TABLET | Freq: Every day | ORAL | 3 refills | Status: AC
Start: 2023-04-01 — End: ?

## 2023-04-02 ENCOUNTER — Encounter: Payer: Self-pay | Admitting: Family Medicine

## 2023-04-02 LAB — CBC
HCT: 38.5 % (ref 36.0–46.0)
Hemoglobin: 12.5 g/dL (ref 12.0–15.0)
MCHC: 32.4 g/dL (ref 30.0–36.0)
MCV: 87.2 fL (ref 78.0–100.0)
Platelets: 322 10*3/uL (ref 150.0–400.0)
RBC: 4.42 Mil/uL (ref 3.87–5.11)
RDW: 15.7 % — ABNORMAL HIGH (ref 11.5–15.5)
WBC: 5.2 10*3/uL (ref 4.0–10.5)

## 2023-04-02 LAB — VITAMIN D 25 HYDROXY (VIT D DEFICIENCY, FRACTURES): VITD: 16 ng/mL — ABNORMAL LOW (ref 30.00–100.00)

## 2023-04-02 LAB — LIPID PANEL
Cholesterol: 222 mg/dL — ABNORMAL HIGH (ref 0–200)
HDL: 102.9 mg/dL (ref >39.00)
LDL Cholesterol: 102 mg/dL — ABNORMAL HIGH (ref 0–99)
NonHDL: 118.73
Total CHOL/HDL Ratio: 2
Triglycerides: 84 mg/dL (ref 0.0–149.0)
VLDL: 16.8 mg/dL (ref 0.0–40.0)

## 2023-04-02 LAB — COMPREHENSIVE METABOLIC PANEL
ALT: 6 U/L (ref 0–35)
AST: 13 U/L (ref 0–37)
Albumin: 4.1 g/dL (ref 3.5–5.2)
Alkaline Phosphatase: 63 U/L (ref 39–117)
BUN: 11 mg/dL (ref 6–23)
CO2: 25 meq/L (ref 19–32)
Calcium: 9.4 mg/dL (ref 8.4–10.5)
Chloride: 106 meq/L (ref 96–112)
Creatinine, Ser: 0.8 mg/dL (ref 0.40–1.20)
GFR: 91.31 mL/min (ref >60.00)
Glucose, Bld: 86 mg/dL (ref 70–99)
Potassium: 3.8 meq/L (ref 3.5–5.1)
Sodium: 139 meq/L (ref 135–145)
Total Bilirubin: 0.3 mg/dL (ref 0.2–1.2)
Total Protein: 6.7 g/dL (ref 6.0–8.3)

## 2023-04-02 LAB — TSH: TSH: 0.43 u[IU]/mL (ref 0.35–5.50)

## 2023-04-02 LAB — HEMOGLOBIN A1C: Hgb A1c MFr Bld: 5.6 % (ref 4.6–6.5)

## 2023-04-02 MED ORDER — VITAMIN D3 1.25 MG (50000 UT) PO CAPS
ORAL_CAPSULE | ORAL | 0 refills | Status: AC
Start: 2023-04-02 — End: ?

## 2023-04-02 NOTE — Addendum Note (Signed)
 Addended by: Abbe Amsterdam C on: 04/02/2023 03:36 PM   Modules accepted: Orders

## 2023-04-13 ENCOUNTER — Telehealth: Payer: Self-pay | Admitting: Genetic Counselor

## 2023-04-13 NOTE — Telephone Encounter (Signed)
 Attempted to schedule patient for Genetic Counseling. Made three attempts to contact the patient. Left two voicemail's for the patient to call back and on the third attempt the voicemail was full.

## 2023-06-04 ENCOUNTER — Telehealth: Payer: Self-pay | Admitting: Genetic Counselor

## 2023-06-04 ENCOUNTER — Encounter: Payer: Self-pay | Admitting: Gastroenterology

## 2023-06-04 NOTE — Telephone Encounter (Signed)
 Rescheduled appointments per patient request. The patient is aware of the changes made.

## 2023-06-08 ENCOUNTER — Other Ambulatory Visit

## 2023-06-08 ENCOUNTER — Inpatient Hospital Stay: Admitting: Genetic Counselor

## 2023-06-11 ENCOUNTER — Inpatient Hospital Stay: Payer: Self-pay

## 2023-06-11 ENCOUNTER — Inpatient Hospital Stay: Admitting: Genetic Counselor

## 2023-07-23 ENCOUNTER — Encounter: Payer: Self-pay | Admitting: Gastroenterology

## 2023-07-23 ENCOUNTER — Ambulatory Visit: Admitting: Gastroenterology

## 2023-07-23 VITALS — BP 120/84 | Ht 63.0 in | Wt 194.5 lb

## 2023-07-23 DIAGNOSIS — R1319 Other dysphagia: Secondary | ICD-10-CM

## 2023-07-23 DIAGNOSIS — K219 Gastro-esophageal reflux disease without esophagitis: Secondary | ICD-10-CM | POA: Diagnosis not present

## 2023-07-23 NOTE — Progress Notes (Signed)
 Chief Complaint: Chronic GERD Primary GI Doctor:Dr. Venice Gillis  HPI:  Patient is a 43year old female patient with past medical history of history of depression, uterine fibroids, GERD, and vitamin D  deficiency who was referred to me by Copland, Skipper Dumas, MD on 04/01/2023 for a complaint of chronic GERD.    Interval History    Patient presents for evaluation of chronic reflux disease she has had since she was in her 43s.  Patient currently taking Protonix  40 mg and takes most days.  Will admit to forgetting to take a dose at times.She states she has intermittent dysphagia and she will vomit the food back up. She states it has increased in frequency as of recent. She admits these symptoms affects her appetite.  No Alcohol use. Nonsmoker.   Patient denies altered bowel habits, abdominal pain, or rectal bleeding  Patient's family history includes mother with reflux issues and dysphagia along with several other family members with GERD.  Wt Readings from Last 3 Encounters:  07/23/23 194 lb 8 oz (88.2 kg)  04/01/23 189 lb 9.6 oz (86 kg)  09/22/22 205 lb 0.4 oz (93 kg)    Past Medical History:  Diagnosis Date   Anxiety    GERD (gastroesophageal reflux disease)    History of chicken pox    Hypertension    Vitamin D  deficiency    Past Surgical History:  Procedure Laterality Date   CESAREAN SECTION N/A 12/11/2021   Procedure: CESAREAN SECTION;  Surgeon: Audelia Leaks, MD;  Location: [facility];  Service: Obstetrics;  Laterality: N/A;   NO PAST SURGERIES     TONSILLECTOMY     XI ROBOTIC ASSISTED VENTRAL HERNIA N/A 09/22/2022   Procedure: ROBOTIC INCISIONAL HERNIA REPAIR WITH MESH;  Surgeon: Stechschulte, Avon Boers, MD;  Location: [facility];  Service: General;  Laterality: N/A;    Current Outpatient Medications  Medication Sig Dispense Refill   Cholecalciferol (VITAMIN D3) 1.25 MG (50000 UT) CAPS Take 1 weekly for 12 weeks 12 capsule 0   pantoprazole  (PROTONIX ) 40 MG tablet Take 1 tablet (40 mg  total) by mouth daily. 90 tablet 3   No current facility-administered medications for this visit.    Allergies as of 07/23/2023 - Review Complete 07/23/2023  Allergen Reaction Noted   Codeine Other (See Comments) 09/15/2010    Family History  Problem Relation Age of Onset   Cancer Mother        Breast cancer dx age 75   Arthritis Mother    Arthritis Father    Diabetes Maternal Grandmother    Hypertension Maternal Grandmother    Diabetes Other        Grandparents   Stroke Other        Grandparents   Colon cancer Neg Hx    Stomach cancer Neg Hx    Esophageal cancer Neg Hx     Review of Systems:    Constitutional: No weight loss, fever, chills, weakness or fatigue HEENT: Eyes: No change in vision               Ears, Nose, Throat:  No change in hearing or congestion Skin: No rash or itching Cardiovascular: No chest pain, chest pressure or palpitations   Respiratory: No SOB or cough Gastrointestinal: See HPI and otherwise negative Genitourinary: No dysuria or change in urinary frequency Neurological: No headache, dizziness or syncope Musculoskeletal: No new muscle or joint pain Hematologic: No bleeding or bruising Psychiatric: No history of depression or anxiety  Physical Exam:  Vital signs: BP 120/84   Ht 5\' 3"  (1.6 m)   Wt 194 lb 8 oz (88.2 kg)   BMI 34.45 kg/m   Constitutional:   Pleasant A. A. female appears to be in NAD, Well developed, Well nourished, alert and cooperative Throat: Oral cavity and pharynx without inflammation, swelling or lesion.  Respiratory: Respirations even and unlabored. Lungs clear to auscultation bilaterally.   No wheezes, crackles, or rhonchi.  Cardiovascular: Normal S1, S2. Regular rate and rhythm. No peripheral edema, cyanosis or pallor.  Gastrointestinal:  Soft, nondistended, nontender. No rebound or guarding. Normal bowel sounds. No appreciable masses or hepatomegaly. Rectal:  Not performed.  Msk:  Symmetrical without gross  deformities. Without edema, no deformity or joint abnormality.  Neurologic:  Alert and  oriented x4;  grossly normal neurologically.  Skin:   Dry and intact without significant lesions or rashes. Psychiatric: Oriented to person, place and time. Demonstrates good judgement and reason without abnormal affect or behaviors.  RELEVANT LABS AND IMAGING: CBC    Latest Ref Rng & Units 04/01/2023    2:31 PM 09/23/2022    5:25 AM 09/22/2022    6:30 PM  CBC  WBC 4.0 - 10.5 K/uL 5.2  9.3  14.3   Hemoglobin 12.0 - 15.0 g/dL 16.1  09.6  04.5   Hematocrit 36.0 - 46.0 % 38.5  38.3  41.3   Platelets 150.0 - 400.0 K/uL 322.0  309  356      CMP     Latest Ref Rng & Units 04/01/2023    2:31 PM 09/23/2022    5:25 AM 09/22/2022    6:30 PM  CMP  Glucose 70 - 99 mg/dL 86  409    BUN 6 - 23 mg/dL 11  8    Creatinine 8.11 - 1.20 mg/dL 9.14  7.82  9.56   Sodium 135 - 145 mEq/L 139  136    Potassium 3.5 - 5.1 mEq/L 3.8  4.2    Chloride 96 - 112 mEq/L 106  102    CO2 19 - 32 mEq/L 25  26    Calcium  8.4 - 10.5 mg/dL 9.4  8.6    Total Protein 6.0 - 8.3 g/dL 6.7     Total Bilirubin 0.2 - 1.2 mg/dL 0.3     Alkaline Phos 39 - 117 U/L 63     AST 0 - 37 U/L 13     ALT 0 - 35 U/L 6        Lab Results  Component Value Date   TSH 0.43 04/01/2023  04/08/2020 ultrasound abdomen limited right upper quadrant IMPRESSION: Normal sonographic appearance of the liver and gallbladder. No Gallstones.   Assessment: Encounter Diagnoses  Name Primary?   Gastroesophageal reflux disease, unspecified whether esophagitis present Yes   Esophageal dysphagia       43 year old African-American female patient that presents with chronic GERD with worsening symptoms of dysphagia.  Patient states she admits she does not take the pantoprazole  on a daily basis and before her first meal of the day.  Will go ahead and proceed with upper GI endoscopy with possible dilatation to rule out EOE, stricture, or Barrett's.  Patient instructed to  restart pantoprazole  40 mg p.o. daily 30 to 45 minutes before meals with strict GERD diet.  Plan: -Continue Pantoprazole  40 mg po daily  -Strict GERD diet, no late meals 3-4 hours after lying down. - Schedule a EGD with possible dilatation in [facility] with Dr. Venice Gillis.  The risks and benefits of EGD with possible biopsies and esophageal dilation were discussed with the patient who agrees to proceed. -start colon screening at age 76  Thank you for the courtesy of this consult. Please call me with any questions or concerns.   Elias Bordner, FNP-C [facility] 07/23/2023, 4:11 PM  Cc: Copland, Skipper Dumas, MD

## 2023-07-23 NOTE — Patient Instructions (Addendum)
-  Continue Pantoprazole  40 mg po daily  -Strict GERD diet, no late meals 3-4 hours after lying down.  Dysphagia precautions:  1. Take reflux medications 30+ minutes before food in the morning 2. Begin meals with warm beverage 3. Eat smaller more frequent meals 4. Eat slowly, taking small bites and sips 5. Alternate solids and liquids 6. Avoid foods/liquids that increase acid production 7. Sit upright during and for 30+ minutes after meals to facilitate esophageal clearing 8. All meats should be chopped finely.   If something gets hung in your esophagus and will not come up or go down, proceed to the emergency room.    You have been scheduled for an endoscopy. Please follow written instructions given to you at your visit today.  If you use inhalers (even only as needed), please bring them with you on the day of your procedure.  If you take any of the following medications, they will need to be adjusted prior to your procedure:   DO NOT TAKE 7 DAYS PRIOR TO TEST- Trulicity (dulaglutide) Ozempic, Wegovy (semaglutide) Mounjaro (tirzepatide) Bydureon Bcise (exanatide extended release)  DO NOT TAKE 1 DAY PRIOR TO YOUR TEST Rybelsus (semaglutide) Adlyxin (lixisenatide) Victoza (liraglutide) Byetta (exanatide) ___________________________________________________________________________  Due to recent changes in healthcare laws, you may see the results of your imaging and laboratory studies on MyChart before your provider has had a chance to review them.  We understand that in some cases there may be results that are confusing or concerning to you. Not all laboratory results come back in the same time frame and the provider may be waiting for multiple results in order to interpret others.  Please give us  48 hours in order for your provider to thoroughly review all the results before contacting the office for clarification of your results.    _______________________________________________________  If your blood pressure at your visit was 140/90 or greater, please contact your primary care physician to follow up on this.  _______________________________________________________  If you are age 41 or older, your body mass index should be between 23-30. Your Body mass index is 34.45 kg/m. If this is out of the aforementioned range listed, please consider follow up with your Primary Care Provider.  If you are age 45 or younger, your body mass index should be between 19-25. Your Body mass index is 34.45 kg/m. If this is out of the aformentioned range listed, please consider follow up with your Primary Care Provider.   ________________________________________________________  The [facility] providers would like to encourage you to use MYCHART to communicate with providers for non-urgent requests or questions.  Due to long hold times on the telephone, sending your provider a message by [facility] may be a faster and more efficient way to get a response.  Please allow 48 business hours for a response.  Please remember that this is for non-urgent requests.  _______________________________________________________  Thank you for trusting me with your gastrointestinal care!   Dyanna Glasgow, RNP

## 2023-07-25 NOTE — Progress Notes (Signed)
 Attending physician's note   Agree with assessment/plan.  Magnus Schuller, MD [facility] [phone]

## 2023-08-12 ENCOUNTER — Telehealth: Payer: Self-pay | Admitting: Genetic Counselor

## 2023-08-12 NOTE — Telephone Encounter (Signed)
 Emily Pham was acceptable to rescheduling her Genetics Counseling appointment.

## 2023-09-10 ENCOUNTER — Other Ambulatory Visit

## 2023-09-10 ENCOUNTER — Encounter: Admitting: Genetic Counselor

## 2023-09-14 ENCOUNTER — Encounter: Admitting: Gastroenterology

## 2023-10-11 ENCOUNTER — Telehealth: Payer: Self-pay | Admitting: Genetic Counselor

## 2023-10-11 NOTE — Telephone Encounter (Signed)
 Ms. Ueda cancelled her genetic counseling appointment because her daughter recently had surgery. She plans to reschedule but did not want to at this time.   Ayra Hodgdon, MS, [facility] Genetic Counselor [email or web address] (P) [phone]

## 2023-10-12 ENCOUNTER — Inpatient Hospital Stay

## 2023-10-12 ENCOUNTER — Inpatient Hospital Stay: Admitting: Genetic Counselor

## 2024-03-14 ENCOUNTER — Ambulatory Visit
Admission: EM | Admit: 2024-03-14 | Discharge: 2024-03-14 | Disposition: A | Discharge status: Home / Self Care | Source: Home / Self Care | Attending: Family Medicine | Admitting: Family Medicine

## 2024-03-14 DIAGNOSIS — J019 Acute sinusitis, unspecified: Secondary | ICD-10-CM

## 2024-03-14 DIAGNOSIS — K529 Noninfective gastroenteritis and colitis, unspecified: Secondary | ICD-10-CM

## 2024-03-14 MED ORDER — FLUCONAZOLE 150 MG PO TABS: 150.0000 mg | ORAL_TABLET | ORAL | 0 refills | Status: AC

## 2024-03-14 MED ORDER — DOXYCYCLINE HYCLATE 100 MG PO CAPS: 100.0000 mg | ORAL_CAPSULE | Freq: Two times a day (BID) | ORAL | 0 refills | Status: AC

## 2024-03-14 MED ORDER — ONDANSETRON 4 MG PO TBDP
4.0000 mg | ORAL_TABLET | Freq: Once | ORAL | Status: AC
  Administered 2024-03-14: 14:00:00 4 mg via ORAL

## 2024-03-14 MED ORDER — BENZONATATE 100 MG PO CAPS: 100.0000 mg | ORAL_CAPSULE | Freq: Three times a day (TID) | ORAL | 0 refills | Status: AC | PRN

## 2024-03-14 NOTE — Discharge Instructions (Signed)
 Ondansetron  dissolved in the mouth every 8 hours as needed for nausea or vomiting. Clear liquids(water , gatorade/pedialyte, ginger ale/sprite, chicken broth/soup) and bland things(crackers/toast, rice, potato, bananas) to eat. Avoid acidic foods like lemon/lime/orange/tomato, and avoid greasy/spicy foods.  We gave you 1 dose of this medication in clinic  Begin the following medications for the sinus infection in 1 to 2 days, when your stomach is feeling better.  Take doxycycline  100 mg --1 capsule 2 times daily for 7 days  Take fluconazole  150 mg--1 tablet every 3 days for 2 doses

## 2024-03-14 NOTE — ED Provider Notes (Signed)
 [facility]    CSN: [id number] Arrival date & time: 03/14/24  1332      History   Chief Complaint Chief Complaint  Patient presents with   Nasal Congestion   Cough    HPI Emily Pham is a 43 y.o. female.    Cough Here for nausea and vomiting and diarrhea that started today.  She has had several episodes of emesis and several episodes of diarrhea today.  No fever noted at home, but her temperature is 99.4 here  No blood in any output  She has also had cough and nasal congestion and sinus pressure; symptoms began about 2 days ago and have worsened.  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  Past Medical History:  Diagnosis Date   Anxiety    GERD (gastroesophageal reflux disease)    History of chicken pox    Hypertension    Vitamin D  deficiency     Patient Active Problem List   Diagnosis Date Noted   Incisional hernia 09/22/2022   History of depression 12/11/2021   Uterine fibroids affecting pregnancy 12/11/2021   Status post primary low transverse cesarean section 12/11/2021    Past Surgical History:  Procedure Laterality Date   CESAREAN SECTION N/A 12/11/2021   Procedure: CESAREAN SECTION;  Surgeon: Kandyce Sor, MD;  Location: [facility];  Service: Obstetrics;  Laterality: N/A;   NO PAST SURGERIES     TONSILLECTOMY     XI ROBOTIC ASSISTED VENTRAL HERNIA N/A 09/22/2022   Procedure: ROBOTIC INCISIONAL HERNIA REPAIR WITH MESH;  Surgeon: Stechschulte, Deward PARAS, MD;  Location: [facility];  Service: General;  Laterality: N/A;    OB History     Gravida  1   Para  1   Term  1   Preterm      AB      Living  1      SAB      IAB      Ectopic      Multiple  0   Live Births  1            Home Medications    Prior to Admission medications  Medication Sig Start Date End Date Taking? Authorizing Provider  benzonatate   (TESSALON ) 100 MG capsule Take 1 capsule (100 mg total) by mouth 3 (three) times daily as needed for cough. 03/14/24  Yes Vonna Sharlet POUR, MD  doxycycline  (VIBRAMYCIN ) 100 MG capsule Take 1 capsule (100 mg total) by mouth 2 (two) times daily for 7 days. 03/14/24 03/21/24 Yes Vonna Sharlet POUR, MD  fluconazole  (DIFLUCAN ) 150 MG tablet Take 1 tablet (150 mg total) by mouth every 3 (three) days for 2 doses. 03/14/24 03/18/24 Yes Conita Amenta K, MD  Cholecalciferol (VITAMIN D3) 1.25 MG (50000 UT) CAPS Take 1 weekly for 12 weeks 04/02/23   Copland, Harlene BROCKS, MD  pantoprazole  (PROTONIX ) 40 MG tablet Take 1 tablet (40 mg total) by mouth daily. 04/01/23   Copland, Harlene BROCKS, MD    Family History Family History  Problem Relation Age of Onset   Cancer Mother        Breast cancer dx age 48   Arthritis Mother    Arthritis Father    Diabetes Maternal Grandmother    Hypertension Maternal Grandmother    Diabetes Other        Grandparents  Stroke Other        Grandparents   Colon cancer Neg Hx    Stomach cancer Neg Hx    Esophageal cancer Neg Hx     Social History Social History[1]   Allergies   Codeine   Review of Systems Review of Systems  Respiratory:  Positive for cough.      Physical Exam Triage Vital Signs ED Triage Vitals  Encounter Vitals Group     BP 03/14/24 1359 138/88     Girls Systolic BP Percentile --      Girls Diastolic BP Percentile --      Boys Systolic BP Percentile --      Boys Diastolic BP Percentile --      Pulse Rate 03/14/24 1359 90     Resp 03/14/24 1359 18     Temp 03/14/24 1359 99.4 F (37.4 C)     Temp Source 03/14/24 1359 Oral     SpO2 03/14/24 1359 95 %     Weight --      Height --      Head Circumference --      Peak Flow --      Pain Score 03/14/24 1358 6     Pain Loc --      Pain Education --      Exclude from Growth Chart --    No data found.  Updated Vital Signs BP 138/88 (BP Location: Right Arm)   Pulse 90   Temp 99.4 F  (37.4 C) (Oral)   Resp 18   SpO2 95%   Visual Acuity Right Eye Distance:   Left Eye Distance:   Bilateral Distance:    Right Eye Near:   Left Eye Near:    Bilateral Near:     Physical Exam Vitals reviewed.  Constitutional:      General: She is not in acute distress.    Appearance: She is not ill-appearing, toxic-appearing or diaphoretic.  HENT:     Right Ear: Tympanic membrane and ear canal normal.     Left Ear: Tympanic membrane and ear canal normal.     Nose: Congestion present.     Mouth/Throat:     Mouth: Mucous membranes are moist.     Comments: No erythema.  There is some clear exudate draining.  No tonsillar hypertrophy Eyes:     Extraocular Movements: Extraocular movements intact.     Conjunctiva/sclera: Conjunctivae normal.     Pupils: Pupils are equal, round, and reactive to light.  Cardiovascular:     Rate and Rhythm: Normal rate and regular rhythm.     Heart sounds: No murmur heard. Pulmonary:     Effort: Pulmonary effort is normal. No respiratory distress.     Breath sounds: No stridor. No wheezing, rhonchi or rales.  Abdominal:     Palpations: Abdomen is soft.     Tenderness: There is no abdominal tenderness.  Musculoskeletal:     Cervical back: Neck supple.  Lymphadenopathy:     Cervical: No cervical adenopathy.  Skin:    Capillary Refill: Capillary refill takes less than 2 seconds.     Coloration: Skin is not jaundiced or pale.  Neurological:     General: No focal deficit present.     Mental Status: She is alert and oriented to person, place, and time.  Psychiatric:        Behavior: Behavior normal.      UC Treatments / Results  Labs (all labs ordered are listed, but  only abnormal results are displayed) Labs Reviewed - No data to display  EKG   Radiology No results found.  Procedures Procedures (including critical care time)  Medications Ordered in UC Medications  ondansetron  (ZOFRAN -ODT) disintegrating tablet 4 mg (4 mg Oral  Given 03/14/24 1427)    Initial Impression / Assessment and Plan / UC Course  I have reviewed the triage vital signs and the nursing notes.  Pertinent labs & imaging results that were available during my care of the patient were reviewed by me and considered in my medical decision making (see chart for details).      Zofran  is given here for the nausea and Zofran  prescription sent to the pharmacy  We discussed clear liquids and a bland diet  Also, she will begin taking doxycycline  in 1 to 2 days when her stomach is feeling better.  This is for the sinusitis.  She request medicine first vaginal candidiasis as she has been to take an antibiotic.  Diflucan  is sent in for that purpose  Tessalon  Perles are sent in for the cough Final Clinical Impressions(s) / UC Diagnoses   Final diagnoses:  Gastroenteritis  Acute sinusitis, recurrence not specified, unspecified location     Discharge Instructions      Ondansetron  dissolved in the mouth every 8 hours as needed for nausea or vomiting. Clear liquids(water , gatorade/pedialyte, ginger ale/sprite, chicken broth/soup) and bland things(crackers/toast, rice, potato, bananas) to eat. Avoid acidic foods like lemon/lime/orange/tomato, and avoid greasy/spicy foods.  We gave you 1 dose of this medication in clinic  Begin the following medications for the sinus infection in 1 to 2 days, when your stomach is feeling better.  Take doxycycline  100 mg --1 capsule 2 times daily for 7 days  Take fluconazole  150 mg--1 tablet every 3 days for 2 doses       ED Prescriptions     Medication Sig Dispense Auth. Provider   doxycycline  (VIBRAMYCIN ) 100 MG capsule Take 1 capsule (100 mg total) by mouth 2 (two) times daily for 7 days. 14 capsule Mylasia Vorhees K, MD   fluconazole  (DIFLUCAN ) 150 MG tablet Take 1 tablet (150 mg total) by mouth every 3 (three) days for 2 doses. 2 tablet Vonna Sharlet POUR, MD   benzonatate  (TESSALON ) 100 MG capsule  Take 1 capsule (100 mg total) by mouth 3 (three) times daily as needed for cough. 21 capsule Johnnetta Holstine K, MD      PDMP not reviewed this encounter.    [1]  Social History Tobacco Use   Smoking status: Never   Smokeless tobacco: Never  Vaping Use   Vaping status: Never Used  Substance Use Topics   Alcohol use: No   Drug use: No     Vonna Sharlet POUR, MD 03/14/24 1430

## 2024-03-14 NOTE — ED Triage Notes (Signed)
 Pt present cough with nasal congestion and drainage, symptoms started two weeks ago. Pt  states today having severe diarrhea with abdomen pain and vomiting
